# Patient Record
Sex: Female | Born: 1980 | Race: Asian | Hispanic: No | Marital: Married | Smoking: Never smoker
Health system: Southern US, Community
[De-identification: ages and names within clinical notes are randomized; demographics above are authoritative.]

## PROBLEM LIST (undated history)

## (undated) ENCOUNTER — Inpatient Hospital Stay (HOSPITAL_COMMUNITY): Payer: Self-pay

## (undated) DIAGNOSIS — E039 Hypothyroidism, unspecified: Secondary | ICD-10-CM

## (undated) DIAGNOSIS — Z789 Other specified health status: Secondary | ICD-10-CM

## (undated) DIAGNOSIS — E079 Disorder of thyroid, unspecified: Secondary | ICD-10-CM

## (undated) HISTORY — PX: APPENDECTOMY: SHX54

---

## 2011-09-24 ENCOUNTER — Emergency Department (HOSPITAL_BASED_OUTPATIENT_CLINIC_OR_DEPARTMENT_OTHER)
Admission: EM | Admit: 2011-09-24 | Discharge: 2011-09-25 | Disposition: A | Discharge status: Other Acute Inpatient Hospital | Payer: BC Managed Care – PPO | Source: Home / Self Care | Attending: Emergency Medicine | Admitting: Emergency Medicine

## 2011-09-24 ENCOUNTER — Emergency Department (INDEPENDENT_AMBULATORY_CARE_PROVIDER_SITE_OTHER): Payer: BC Managed Care – PPO

## 2011-09-24 DIAGNOSIS — R109 Unspecified abdominal pain: Secondary | ICD-10-CM

## 2011-09-24 DIAGNOSIS — R1031 Right lower quadrant pain: Secondary | ICD-10-CM | POA: Insufficient documentation

## 2011-09-24 DIAGNOSIS — K3580 Unspecified acute appendicitis: Secondary | ICD-10-CM | POA: Insufficient documentation

## 2011-09-24 DIAGNOSIS — E079 Disorder of thyroid, unspecified: Secondary | ICD-10-CM | POA: Insufficient documentation

## 2011-09-24 HISTORY — DX: Disorder of thyroid, unspecified: E07.9

## 2011-09-24 LAB — URINALYSIS, ROUTINE W REFLEX MICROSCOPIC
Glucose, UA: NEGATIVE mg/dL
Hgb urine dipstick: NEGATIVE
Specific Gravity, Urine: 1.007 (ref 1.005–1.030)
pH: 7 (ref 5.0–8.0)

## 2011-09-24 LAB — WET PREP, GENITAL: Yeast Wet Prep HPF POC: NONE SEEN

## 2011-09-24 LAB — CBC
HCT: 35.8 % — ABNORMAL LOW (ref 36.0–46.0)
Hemoglobin: 11.9 g/dL — ABNORMAL LOW (ref 12.0–15.0)
WBC: 12.7 10*3/uL — ABNORMAL HIGH (ref 4.0–10.5)

## 2011-09-24 LAB — BASIC METABOLIC PANEL
BUN: 7 mg/dL (ref 6–23)
CO2: 26 mEq/L (ref 19–32)
Chloride: 104 mEq/L (ref 96–112)
Creatinine, Ser: 0.6 mg/dL (ref 0.50–1.10)
Potassium: 4.4 mEq/L (ref 3.5–5.1)

## 2011-09-24 LAB — DIFFERENTIAL
Basophils Absolute: 0 10*3/uL (ref 0.0–0.1)
Lymphocytes Relative: 21 % (ref 12–46)
Monocytes Absolute: 0.7 10*3/uL (ref 0.1–1.0)
Monocytes Relative: 6 % (ref 3–12)
Neutro Abs: 9.1 10*3/uL — ABNORMAL HIGH (ref 1.7–7.7)
Neutrophils Relative %: 72 % (ref 43–77)

## 2011-09-24 LAB — PREGNANCY, URINE: Preg Test, Ur: NEGATIVE

## 2011-09-24 MED ORDER — FENTANYL CITRATE 0.05 MG/ML IJ SOLN
100.0000 ug | Freq: Once | INTRAMUSCULAR | Status: AC
  Administered 2011-09-24: 100 ug via INTRAVENOUS
  Filled 2011-09-24: qty 2

## 2011-09-24 MED ORDER — ONDANSETRON HCL 4 MG/2ML IJ SOLN
4.0000 mg | Freq: Once | INTRAMUSCULAR | Status: AC
  Administered 2011-09-24: 4 mg via INTRAVENOUS
  Filled 2011-09-24: qty 2

## 2011-09-24 MED ORDER — IOHEXOL 300 MG/ML  SOLN
100.0000 mL | Freq: Once | INTRAMUSCULAR | Status: AC | PRN
  Administered 2011-09-24: 100 mL via INTRAVENOUS

## 2011-09-24 MED ORDER — IOHEXOL 300 MG/ML  SOLN: 100.0000 mL | Freq: Once | INTRAMUSCULAR | Status: AC | PRN

## 2011-09-24 MED ORDER — DEXTROSE 5 % IV SOLN
1.0000 g | Freq: Once | INTRAVENOUS | Status: AC
  Administered 2011-09-24: 1 g via INTRAVENOUS
  Filled 2011-09-24: qty 1

## 2011-09-24 NOTE — ED Notes (Signed)
All LDA's (lines, drains, airways, and wounds) were removed from documentation for discharge purposes.  At time of discharge all LDA's remained intact as were previously documented.    

## 2011-09-24 NOTE — ED Notes (Signed)
Transported to ct via stretcher

## 2011-09-24 NOTE — ED Provider Notes (Signed)
History     CSN: [id number] Arrival date & time: 09/24/2011  8:03 PM   First MD Initiated Contact with Patient 09/24/11 2021      Chief Complaint  Patient presents with  . Abdominal Pain    (Consider location/radiation/quality/duration/timing/severity/associated sxs/prior treatment) Patient is a 30 y.o. female presenting with abdominal pain.  Abdominal Pain The primary symptoms of the illness include abdominal pain.    Past Medical History  Diagnosis Date  . Thyroid disease     History reviewed. No pertinent past surgical history.  History reviewed. No pertinent family history.  History  Substance Use Topics  . Smoking status: Never Smoker   . Smokeless tobacco: Not on file  . Alcohol Use: Yes     occasional drinker    OB History    Grav Para Term Preterm Abortions TAB SAB Ect Mult Living                  Review of Systems  Gastrointestinal: Positive for abdominal pain.    Allergies  Review of patient's allergies indicates no known allergies.  Home Medications   Current Outpatient Rx  Name Route Sig Dispense Refill  . IBUPROFEN 200 MG PO TABS Oral Take 200 mg by mouth once.      Marland Kitchen LEVOTHYROXINE SODIUM 50 MCG PO TABS Oral Take 50 mcg by mouth daily.        BP 106/75  Pulse 97  Temp(Src) 99.1 F (37.3 C) (Oral)  Resp 17  Ht 5\' 2"  (1.575 m)  Wt 115 lb (52.164 kg)  BMI 21.03 kg/m2  SpO2 100%  LMP 08/25/2011  Physical Exam  ED Course  Procedures (including critical care time)  Labs Reviewed  CBC - Abnormal; Notable for the following:    WBC 12.7 (*)    Hemoglobin 11.9 (*)    HCT 35.8 (*)    MCV 74.1 (*)    MCH 24.6 (*)    RDW 16.0 (*)    All other components within normal limits  DIFFERENTIAL - Abnormal; Notable for the following:    Neutro Abs 9.1 (*)    All other components within normal limits  WET PREP, GENITAL - Abnormal; Notable for the following:    Clue Cells, Wet Prep MODERATE (*)    WBC, Wet Prep HPF POC MODERATE (*)    All  other components within normal limits  URINALYSIS, ROUTINE W REFLEX MICROSCOPIC  PREGNANCY, URINE  BASIC METABOLIC PANEL   Ct Abdomen Pelvis W Contrast  09/24/2011  *RADIOLOGY REPORT*  Clinical Data: Mid abdominal pain with acute onset earlier today, worse with ambulation.  CT ABDOMEN AND PELVIS WITH CONTRAST 09/24/2011:  Technique:  Multidetector CT imaging of the abdomen and pelvis was performed following the standard protocol during bolus administration of intravenous contrast.  Contrast: OMNIPAQUE IOHEXOL 300 MG/ML IV SOLN Oral contrast was also administered.  Comparison: None.  Findings: Appendix mildly inflamed with enhancing mucosa and mild periappendiceal edema.  No evidence of abscess.  Stomach, small bowel, and colon normal in appearance.  Normal appearing liver with an anatomic variant in that the left lobe extends well across the midline into the left upper quadrant. Normal spleen, pancreas, adrenal glands, and kidneys.  Gallbladder unremarkable by CT.  No biliary ductal dilation.  No visible aorto- iliofemoral atherosclerosis.  Widely patent visceral arteries.  No significant lymphadenopathy; scattered small lymph nodes in the periappendiceal region, likely reactive.  Uterus and ovaries unremarkable for age; enhancing functional cyst in the  left ovary.  Small amount of free fluid in the cul-de-sac, likely physiologic.  Urinary bladder unremarkable.  Bone window images unremarkable.  Visualized lung bases clear.  Heart size normal.  IMPRESSION:  1.  Acute appendicitis.  No evidence of periappendiceal abscess. 2.  Otherwise normal examination.  Physiologic free fluid in the cul-de-sac.  These results were called by telephone on 09/24/2011  at  2305 hours to  Dr. Radford Pax of the emergency department, who verbally acknowledged these results.  Original Report Authenticated By: Arnell Sieving, M.D.     No diagnosis found.    Medical screening examination/treatment/procedure(s) were  performed by non-physician practitioner and as supervising physician I was immediately available for consultation/collaboration.    I spoke with Dr. Johna Sheriff  Who is  the surgeon on call who agreed to accept the patient at [facility].  [facility] arrange for transfer.       Nelia Shi, MD 09/24/11 2330

## 2011-09-24 NOTE — ED Provider Notes (Signed)
History     CSN: [id number] Arrival date & time: 09/24/2011  8:03 PM   First MD Initiated Contact with Patient 09/24/11 2021      Chief Complaint  Patient presents with  . Abdominal Pain    (Consider location/radiation/quality/duration/timing/severity/associated sxs/prior treatment) Patient is a 30 y.o. female presenting with abdominal pain.  Abdominal Pain The primary symptoms of the illness include abdominal pain. The primary symptoms of the illness do not include vaginal discharge. The current episode started 13 to 24 hours ago. The onset of the illness was gradual. The problem has been gradually worsening.  The illness is associated with eating. The patient states that she believes she is currently not pregnant. The patient has not had a change in bowel habit. Significant associated medical issues do not include PUD, inflammatory bowel disease or gallstones.   patient reports she began having abdominal pain around 11 AM today patient reports that she thought it might be caused by a piece of chocolate cake that she ate for breakfast. Patient complains of feeling like she is bloated patient reports she feels very gassy. Patient denies fever or chills she denies any vaginal discharge patient denies any risk of any sexually transmitted disease she has not had any abdominal surgeries   Past Medical History  Diagnosis Date  . Thyroid disease     History reviewed. No pertinent past surgical history.  History reviewed. No pertinent family history.  History  Substance Use Topics  . Smoking status: Never Smoker   . Smokeless tobacco: Not on file  . Alcohol Use: Yes     occasional drinker    OB History    Grav Para Term Preterm Abortions TAB SAB Ect Mult Living                  Review of Systems  Gastrointestinal: Positive for abdominal pain.  Genitourinary: Negative for vaginal discharge, vaginal pain and pelvic pain.  All other systems reviewed and are  negative.    Allergies  Review of patient's allergies indicates no known allergies.  Home Medications   Current Outpatient Rx  Name Route Sig Dispense Refill  . IBUPROFEN 200 MG PO TABS Oral Take 200 mg by mouth once.      Marland Kitchen LEVOTHYROXINE SODIUM 50 MCG PO TABS Oral Take 50 mcg by mouth daily.        BP 106/75  Pulse 97  Temp(Src) 99.1 F (37.3 C) (Oral)  Resp 17  Ht 5\' 2"  (1.575 m)  Wt 115 lb (52.164 kg)  BMI 21.03 kg/m2  SpO2 100%  LMP 08/25/2011  Physical Exam  Nursing note and vitals reviewed. Constitutional: She is oriented to person, place, and time. She appears well-developed and well-nourished.  HENT:  Head: Normocephalic and atraumatic.  Right Ear: External ear normal.  Left Ear: External ear normal.  Nose: Nose normal.  Mouth/Throat: Oropharynx is clear and moist.  Eyes: Conjunctivae and EOM are normal. Pupils are equal, round, and reactive to light.  Neck: Normal range of motion. Neck supple.  Cardiovascular: Normal rate and normal heart sounds.   Pulmonary/Chest: Effort normal and breath sounds normal.  Abdominal: Soft. There is tenderness.       Tender right lower quadrant and suprapubic area  Genitourinary: Vagina normal and uterus normal.  Musculoskeletal: Normal range of motion.  Neurological: She is alert and oriented to person, place, and time. She has normal reflexes.  Skin: Skin is warm and dry.  Psychiatric: She has a normal mood  and affect.    ED Course  Procedures (including critical care time)   Labs Reviewed  URINALYSIS, ROUTINE W REFLEX MICROSCOPIC  PREGNANCY, URINE   No results found.   No diagnosis found.    MDM          Langston Masker, PA 09/24/11 2154  Langston Masker, [place] 09/24/11 2155

## 2011-09-24 NOTE — ED Notes (Signed)
Via carelink--spoke with [name] [name] 

## 2011-09-24 NOTE — ED Provider Notes (Signed)
Medical screening examination/treatment/procedure(s) were conducted as a shared visit with non-physician practitioner(s) and myself.  I personally evaluated the patient during the encounter   Nelia Shi, MD 09/24/11 [phone]

## 2011-09-24 NOTE — ED Notes (Signed)
Pt returned from CT requesting pain medication. Ambulatory to bathroom.

## 2011-09-24 NOTE — ED Notes (Signed)
Pt c/o mid abdominal pain which increases with ambulation.  Pt states that it is a cramping sensation with onset today while she was at school.  Pt states that abdomen is painful to palpation.  Denies n/v/d/c, believes that this may be related to gas.  Denies dysuria, hematuria, discharge

## 2011-09-25 ENCOUNTER — Observation Stay (HOSPITAL_COMMUNITY)
Admission: EM | Admit: 2011-09-25 | Discharge: 2011-09-26 | DRG: 883 | Disposition: A | Discharge status: Home / Self Care | Payer: BC Managed Care – PPO | Attending: General Surgery | Admitting: General Surgery

## 2011-09-25 ENCOUNTER — Other Ambulatory Visit (INDEPENDENT_AMBULATORY_CARE_PROVIDER_SITE_OTHER): Payer: Self-pay | Admitting: General Surgery

## 2011-09-25 DIAGNOSIS — K3580 Unspecified acute appendicitis: Principal | ICD-10-CM | POA: Insufficient documentation

## 2011-09-25 DIAGNOSIS — E039 Hypothyroidism, unspecified: Secondary | ICD-10-CM | POA: Insufficient documentation

## 2011-09-25 DIAGNOSIS — D72828 Other elevated white blood cell count: Secondary | ICD-10-CM

## 2011-09-25 DIAGNOSIS — R1031 Right lower quadrant pain: Secondary | ICD-10-CM

## 2011-09-25 DIAGNOSIS — Z79899 Other long term (current) drug therapy: Secondary | ICD-10-CM | POA: Insufficient documentation

## 2011-09-27 NOTE — H&P (Signed)
  [name], [name]             ACCOUNT NO.:  [id number]  MEDICAL RECORD NO.:  [id number]  LOCATION:  WLED                         FACILITY:  [facility]  PHYSICIAN:  Sharlet Salina T. Farhaan Mabee, M.D.DATE OF BIRTH:  11-May-1981  DATE OF ADMISSION:  09/25/2011 DATE OF DISCHARGE:                             HISTORY & PHYSICAL   CHIEF COMPLAINT:  Abdominal pain.  HISTORY OF PRESENT ILLNESS:  This patient is a 30 year old female who about 36 hours prior to admission developed the gradual onset of persistent right lower quadrant abdominal pain.  This has been moderately severe.  The pain has been persistent and gradually worsening.  It is little worse with motion.  She has not had any nausea or vomiting.  No fever.  Denies lack of appetite.  She has no history of any recurrent or chronic similar symptoms.  PAST MEDICAL HISTORY:  No previous surgery.  Medically, she is treated for hypothyroidism.  MEDICATIONS:  Synthroid 300 mcg daily.  ALLERGIES:  None.  SOCIAL HISTORY:  She is married.  Drinks occasional alcohol.  Does not smoke cigarettes.  She is a Consulting civil engineer.  FAMILY HISTORY:  Significant for stroke and hypertension.  REVIEW OF SYSTEMS:  GENERAL:  No fever, chills, or malaise. RESPIRATORY:  No shortness breath, cough, or wheezing.  CARDIAC:  No heart murmurs or history of heart disease.  ABDOMEN/GI as above.  GU: No urinary burning, frequency, or discharge.  PHYSICAL EXAMINATION:  VITAL SIGNS:  Temperature is 98.3, heart rate 98, blood pressure 107/61, respirations 16. GENERAL:  She is a well-developed female who does not appear in any distress. SKIN:  Warm, dry.  No rash or infection. HEENT:  No palpable masses or thyromegaly.  Sclerae nonicteric.  Pupils equal, round, and reactive.  Oropharynx clear. LYMPH NODES:  No cervical, supraclavicular, or inguinal nodes palpable. LUNGS:  Clear without wheezing or increased work of breathing. CARDIAC:  Regular rate and rhythm.  No  murmurs. ABDOMEN:  Bowel sounds are hypoactive.  There is localized right lower quadrant tenderness with some guarding.  No peritoneal signs.  No palpable masses or organomegaly or hernias. EXTREMITIES:  No joint swelling, deformity. NEUROLOGIC:  Alert, fully oriented.  Gait normal.  LABORATORY/X-RAY:  [place] at [address], hemoglobin 11.9. Electrolytes normal.  CT scan of the abdomen and pelvis was obtained from the emergency room. This shows evidence of acute appendicitis with thickening and mild periappendiceal inflammation, and no evidence of perforation.  ASSESSMENT/PLAN:  Symptoms, physical findings, and imaging consistent with acute appendicitis.  I have recommended proceeding with laparoscopic appendectomy.  She is receiving preoperative IV antibiotics.     Lorne Skeens. Amaro Mangold, M.D.     Tory Emerald  D:  09/25/2011  T:  09/25/2011  Job:  [id number]  Electronically Signed by Glenna Fellows M.D. on 09/27/2011 12:38:07 PM

## 2011-09-27 NOTE — Op Note (Signed)
  [name], [name] [name]             ACCOUNT NO.:  [id number]  MEDICAL RECORD NO.:  [id number]  LOCATION:  WLED                         FACILITY:  [facility]  PHYSICIAN:  [name] [name], M.D.DATE OF BIRTH:  1981-01-13  DATE OF PROCEDURE:  09/25/2011 DATE OF DISCHARGE:                              OPERATIVE REPORT   PREOPERATIVE DIAGNOSIS:  Acute appendicitis.  POSTOPERATIVE DIAGNOSIS:  Acute appendicitis.  PROCEDURE:  Laparoscopic appendectomy.  SURGEON:  [name] [name], M.D.  ANESTHESIA:  General.  BRIEF HISTORY:  This patient is a 30 year old female who presents with typical symptoms, physical findings, and imaging for uncomplicated acute appendicitis.  I have recommended proceeding with laparoscopic appendectomy.  [name] [name] of the procedure, its indications, the risks of anesthetic complications, bleeding, infection, possible need for open procedure were discussed and understood.  She was now brought to the operating room for this procedure.  DESCRIPTION OF OPERATION:  The patient was brought to the operating room, placed in supine position on operating table and general endotracheal anesthesia was induced.  She had received preoperative IV antibiotics.  PAS were in place.  The abdomen was widely sterilely prepped and draped.  Foley catheter had been placed.  Correct patient and procedure were verified.  Access was obtained with a 1 cm open Hasson technique with the incision in the umbilicus and pneumoperitoneum was established without difficulty.  Under direct vision, a 5 mm trocar was placed in the right upper quadrant, a 12 mm trocar in the left lower quadrant.  The appendix was exposed, lying lateral to the cecum and appeared to have early, but distinct acute appendicitis without evidence of gangrene or perforation.  The tip of the appendix was grasped and elevated.  Lateral peritoneal attachments were divided with Harmonic scalpel mobilizing the appendix.  The  mesoappendix was then sequentially divided with Harmonic scalpel completely, freeing the appendix down to the tip of the cecum.  The base was noninflamed.  Appendix was divided from the tip of the cecum with a single firing of the Endo-GIA 45 mm blue load stapler.  The staple line was intact without bleeding.  The appendix was placed in Endocatch bag and brought out through the umbilical incision.  The abdomen was inspected for hemostasis, which appeared complete.  There was no evidence of trocar injury or other problems.  All CO2 was evacuated.  The mattress sutures was secured at the umbilicus.  Skin incisions were closed with subcuticular Monocryl and Dermabond.  Sponge and needle counts correct.  The patient taken to recovery in good condition.     [name] [name], M.D.     [name] [name]  D:  09/25/2011  T:  09/25/2011  Job:  [id number]  Electronically Signed by Glenna Fellows M.D. on 09/27/2011 12:38:00 PM

## 2011-10-02 NOTE — Discharge Summary (Signed)
  NAMEALYX, [name] [name]             ACCOUNT NO.:  [id number]  MEDICAL RECORD NO.:  [id number]  LOCATION:  1537                         FACILITY:  [facility]  PHYSICIAN:  Sandria Bales. Ezzard Standing, M.D.  DATE OF BIRTH:  03-17-1981  DATE OF ADMISSION:  09/25/2011 DATE OF DISCHARGE:  09/26/2011                              DISCHARGE SUMMARY  DISCHARGE DIAGNOSIS:  Acute appendicitis.  OPERATION PERFORMED:  She had a laparoscopic appendectomy by Dr. Jaclynn Guarneri on September 25, 2011.  HISTORY OF ILLNESS:  This is a 30 year old [place] female who had 36 hours history of abdominal pain prior to presenting to the [facility] ER.  She was transferred to [facility] after CT suggested acute appendicitis.  PAST MEDICAL HISTORY:  Noncontributory.  She takes Synthroid for hypothyroidism as her only medicine.  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  She is in a graduate school.  She otherwise has a negative history.  HOSPITAL COURSE:  She was taken to the operating room on September 25, 2011 by Dr. Johna Sheriff where she underwent a laparoscopic appendectomy for acute appendicitis.  Her final pathology is pending at the time of this dictation.  She is now 1 day postop.  She is afebrile.  Her abdomen is soft.  She is tolerating liquids and ready to go home.  Her husband is at her bedside.  DISCHARGE INSTRUCTIONS:  She is given a note to be out of school till November 1st.  She should not drive for 3 days.  She should eat light and then could advance her diet.  She can shower starting Monday, October 29.  She is to call our office, make appointment to see Dr. Johna Sheriff in 2 or 3 weeks and call for any interval problem.  She is given a prescription for Vicodin for pain and can resume her Synthroid as her usual dose.  DISCHARGE CONDITION:  Good.   Sandria Bales. Ezzard Standing, M.D., FACS   DHN/MEDQ  D:  09/26/2011  T:  09/26/2011  Job:  [id number]  cc:   Zelphia Cairo, MD Fax:  [phone]  Electronically Signed by Ovidio Kin M.D. on 10/02/2011 06:31:23 AM

## 2011-11-18 ENCOUNTER — Encounter (INDEPENDENT_AMBULATORY_CARE_PROVIDER_SITE_OTHER): Payer: Self-pay | Admitting: General Surgery

## 2011-11-18 ENCOUNTER — Ambulatory Visit (INDEPENDENT_AMBULATORY_CARE_PROVIDER_SITE_OTHER): Payer: BC Managed Care – PPO | Admitting: General Surgery

## 2011-11-18 VITALS — BP 98/70 | HR 86 | Temp 97.7°F | Ht 61.5 in | Wt 121.8 lb

## 2011-11-18 DIAGNOSIS — Z09 Encounter for follow-up examination after completed treatment for conditions other than malignant neoplasm: Secondary | ICD-10-CM

## 2011-11-18 NOTE — Patient Instructions (Signed)
May resume full physical activity. Call for any concerns or questions

## 2011-11-18 NOTE — Progress Notes (Signed)
Patient returns almost one month following emergency laparoscopic appendectomy for uncomplicated acute appendicitis. She is doing well with no postoperative concerns.  Pathology showed acute appendicitis  Exam: Appears well. Abdomen soft and nontender. Wounds well healed.  Assessment plan: Doing well following appendectomy without complication. Can resume full activity. Return as needed.

## 2013-01-17 LAB — OB RESULTS CONSOLE ABO/RH: RH Type: POSITIVE

## 2013-01-17 LAB — OB RESULTS CONSOLE GC/CHLAMYDIA
Chlamydia: NEGATIVE
Gonorrhea: NEGATIVE

## 2013-01-19 ENCOUNTER — Encounter (HOSPITAL_COMMUNITY): Payer: Self-pay

## 2013-01-19 ENCOUNTER — Inpatient Hospital Stay (HOSPITAL_COMMUNITY): Payer: BC Managed Care – PPO

## 2013-01-19 ENCOUNTER — Inpatient Hospital Stay (HOSPITAL_COMMUNITY)
Admission: AD | Admit: 2013-01-19 | Discharge: 2013-01-19 | Disposition: A | Discharge status: Home / Self Care | Payer: BC Managed Care – PPO | Source: Ambulatory Visit | Attending: Obstetrics and Gynecology | Admitting: Obstetrics and Gynecology

## 2013-01-19 DIAGNOSIS — O209 Hemorrhage in early pregnancy, unspecified: Secondary | ICD-10-CM

## 2013-01-19 DIAGNOSIS — O418X20 Other specified disorders of amniotic fluid and membranes, second trimester, not applicable or unspecified: Secondary | ICD-10-CM

## 2013-01-19 DIAGNOSIS — O4692 Antepartum hemorrhage, unspecified, second trimester: Secondary | ICD-10-CM

## 2013-01-19 DIAGNOSIS — R109 Unspecified abdominal pain: Secondary | ICD-10-CM

## 2013-01-19 HISTORY — DX: Hypothyroidism, unspecified: E03.9

## 2013-01-19 LAB — URINALYSIS, ROUTINE W REFLEX MICROSCOPIC
Bilirubin Urine: NEGATIVE
Ketones, ur: NEGATIVE mg/dL
Leukocytes, UA: NEGATIVE
Nitrite: NEGATIVE
Protein, ur: NEGATIVE mg/dL
Urobilinogen, UA: 0.2 mg/dL (ref 0.0–1.0)
pH: 6 (ref 5.0–8.0)

## 2013-01-19 LAB — URINE MICROSCOPIC-ADD ON

## 2013-01-19 NOTE — MAU Provider Note (Signed)
History     CSN: [id number]  Arrival date and time: 01/19/13 1814   First Provider Initiated Contact with Patient 01/19/13 1843      Chief Complaint  Patient presents with  . Abdominal Pain  . Vaginal Bleeding   HPI [name] [name] is 32 y.o. G1P0 Unknown weeks presenting with vaginal bleeding with clot seen at 5:30pm today.  Denies abdominal pain.  Patient of Dr. Renaldo Fiddler.  Had ultrasound 2/4 in the office showing the pregnancy was 10w 2day.  Denies recent intercourse.  Denies nausea and vomiting.    Past Medical History  Diagnosis Date  . Thyroid disease   . Hypothyroid     Past Surgical History  Procedure Laterality Date  . Appendectomy      Family History  Problem Relation Age of Onset  . Hyperlipidemia Mother   . Hypertension Mother   . Hyperlipidemia Father   . Hypertension Father     History  Substance Use Topics  . Smoking status: Never Smoker   . Smokeless tobacco: Never Used  . Alcohol Use: Yes     Comment: occasional drinker    Allergies: No Known Allergies  Prescriptions prior to admission  Medication Sig Dispense Refill  . ibuprofen (ADVIL,MOTRIN) 200 MG tablet Take 200 mg by mouth once.        Marland Kitchen levothyroxine (SYNTHROID, LEVOTHROID) 50 MCG tablet Take 50 mcg by mouth daily.          Review of Systems  Constitutional: Negative.   Gastrointestinal: Negative for nausea and abdominal pain.  Genitourinary:       + vaginal bleeding.   Physical Exam   Blood pressure 120/77, pulse 123, temperature 97.7 F (36.5 C), temperature source Oral, resp. rate 20, height 5\' 2"  (1.575 m), weight 122 lb 6.4 oz (55.52 kg), last menstrual period 10/09/2012.  Physical Exam  Constitutional: She is oriented to person, place, and time. She appears well-developed and well-nourished. No distress.  HENT:  Head: Normocephalic.  Neck: Normal range of motion.  Cardiovascular: Normal rate.   Respiratory: Effort normal.  GI: Soft. She exhibits no distension and no  mass. There is no tenderness. There is no rebound and no guarding.  Genitourinary: Uterus is enlarged. Uterus is not tender. There is bleeding (small amount of bright red bleeding without clot) around the vagina. No vaginal discharge found.  Cervix closed.  Neurological: She is alert and oriented to person, place, and time.  Skin: Skin is warm and dry.  Psychiatric: She has a normal mood and affect.   Results for orders placed during the hospital encounter of 01/19/13 (from the past 24 hour(s))  URINALYSIS, ROUTINE W REFLEX MICROSCOPIC     Status: Abnormal   Collection Time    01/19/13  6:20 PM      Result Value Range   Color, Urine YELLOW  YELLOW   APPearance HAZY (*) CLEAR   Specific Gravity, Urine 1.020  1.005 - 1.030   pH 6.0  5.0 - 8.0   Glucose, UA NEGATIVE  NEGATIVE mg/dL   Hgb urine dipstick LARGE (*) NEGATIVE   Bilirubin Urine NEGATIVE  NEGATIVE   Ketones, ur NEGATIVE  NEGATIVE mg/dL   Protein, ur NEGATIVE  NEGATIVE mg/dL   Urobilinogen, UA 0.2  0.0 - 1.0 mg/dL   Nitrite NEGATIVE  NEGATIVE   Leukocytes, UA NEGATIVE  NEGATIVE  URINE MICROSCOPIC-ADD ON     Status: Abnormal   Collection Time    01/19/13  6:20 PM  Result Value Range   Squamous Epithelial / LPF FEW (*) RARE   WBC, UA 0-2  <3 WBC/hpf   RBC / HPF TOO NUMEROUS TO COUNT  <3 RBC/hpf   Bacteria, UA FEW (*) RARE  POCT PREGNANCY, URINE     Status: Abnormal   Collection Time    01/19/13  6:28 PM      Result Value Range   Preg Test, Ur POSITIVE (*) NEGATIVE    Clinical Data: Vaginal bleeding. 14 weeks and 4 days pregnant by last menstrual period.  OBSTETRIC <14 WK ULTRASOUND  Technique: Transabdominal ultrasound was performed for evaluation of the gestation as well as the maternal uterus and adnexal regions.  Comparison: None.  Intrauterine gestational sac: Visualized/normal in shape. Yolk sac: Not visualized Embryo: Visualized Cardiac Activity: Visualized Heart Rate: 161 bpm  CRL: 66.0 mm 13 w 0  d [place] EDC: 07/27/2013  Maternal uterus/Adnexae: The maternal ovaries are not visualized. No free peritoneal fluid is seen. Small moderate sized subchorionic hemorrhage.  IMPRESSION:  1. Single live intrauterine gestation with an estimated gestational age of [redacted] weeks and 0 days. 2. Small to moderate sized subchorionic hemorrhage.   Original Report Authenticated By: Beckie Salts, M.D.        MAU Course  Procedures  MDM 18:55 Reported MSE and pelvic exam to Dr. Renaldo Fiddler.  Order for ultrasound to evaluate bleeding. 18:45  Reported Ultrasound findings to Dr. Renaldo Fiddler.  May discharge home with pelvic rest instructions and keep scheduled appt.  Call for worsening sxs.  Assessment and Plan  A:  Single living intrauterine gestation at [redacted]w[redacted]d gestation      Small-moderate sized subchorionic hemorrhage  P:  Pelvic rest until bleeding stops       Keep scheduled appointment     Call for increased sxs.  Keivon Garden,EVE M 01/19/2013, 6:46 PM

## 2013-01-19 NOTE — MAU Note (Signed)
Denies any cramping, states just had a mild pelvic pain- none now.

## 2013-01-19 NOTE — MAU Note (Signed)
Bleeding started about an hour ago.  Passed one small clot. Denies pain at this time.  [place] confirmed IUP at 10 wks.

## 2013-01-19 NOTE — MAU Note (Signed)
Patient states onset of vaginal bleeding about 1 hour ago with cramping. Patient is [redacted] weeks gestation.

## 2013-01-19 NOTE — MAU Note (Signed)
Has a 45day cycle

## 2013-06-15 ENCOUNTER — Inpatient Hospital Stay (HOSPITAL_COMMUNITY)
Admission: AD | Admit: 2013-06-15 | Discharge: 2013-06-19 | DRG: 372 | Disposition: A | Discharge status: Home / Self Care | Payer: BC Managed Care – PPO | Source: Ambulatory Visit | Attending: Obstetrics and Gynecology | Admitting: Obstetrics and Gynecology

## 2013-06-15 ENCOUNTER — Encounter (HOSPITAL_COMMUNITY): Payer: Self-pay | Admitting: *Deleted

## 2013-06-15 DIAGNOSIS — O365931 Maternal care for other known or suspected poor fetal growth, third trimester, fetus 1: Secondary | ICD-10-CM

## 2013-06-15 DIAGNOSIS — O4290 Premature rupture of membranes, unspecified as to length of time between rupture and onset of labor, unspecified weeks of gestation: Principal | ICD-10-CM | POA: Diagnosis present

## 2013-06-15 DIAGNOSIS — E079 Disorder of thyroid, unspecified: Secondary | ICD-10-CM | POA: Diagnosis present

## 2013-06-15 DIAGNOSIS — E039 Hypothyroidism, unspecified: Secondary | ICD-10-CM | POA: Diagnosis present

## 2013-06-15 NOTE — MAU Note (Signed)
Pt states she has been leaking a clearish fluid since 2200

## 2013-06-16 ENCOUNTER — Inpatient Hospital Stay (HOSPITAL_COMMUNITY): Payer: BC Managed Care – PPO

## 2013-06-16 LAB — URINALYSIS, ROUTINE W REFLEX MICROSCOPIC
Glucose, UA: NEGATIVE mg/dL
Leukocytes, UA: NEGATIVE
Specific Gravity, Urine: <1.005 — ABNORMAL LOW (ref 1.005–1.030)
pH: 6.5 (ref 5.0–8.0)

## 2013-06-16 LAB — CBC
HCT: 35.5 % — ABNORMAL LOW (ref 36.0–46.0)
MCV: 81.4 fL (ref 78.0–100.0)
Platelets: 197 10*3/uL (ref 150–400)
RBC: 4.36 MIL/uL (ref 3.87–5.11)
WBC: 11.5 10*3/uL — ABNORMAL HIGH (ref 4.0–10.5)

## 2013-06-16 LAB — URINE MICROSCOPIC-ADD ON

## 2013-06-16 LAB — POCT FERN TEST: POCT Fern Test: POSITIVE

## 2013-06-16 MED ORDER — ZOLPIDEM TARTRATE 5 MG PO TABS: 5.0000 mg | ORAL_TABLET | Freq: Every evening | ORAL | Status: DC | PRN

## 2013-06-16 MED ORDER — SODIUM CHLORIDE 0.9 % IV SOLN
2.0000 g | Freq: Four times a day (QID) | INTRAVENOUS | Status: DC
  Administered 2013-06-16 – 2013-06-17 (×6): 2 g via INTRAVENOUS
  Filled 2013-06-16 (×8): qty 2000

## 2013-06-16 MED ORDER — CALCIUM CARBONATE ANTACID 500 MG PO CHEW: 2.0000 | CHEWABLE_TABLET | ORAL | Status: DC | PRN

## 2013-06-16 MED ORDER — ACETAMINOPHEN 325 MG PO TABS: 650.0000 mg | ORAL_TABLET | ORAL | Status: DC | PRN

## 2013-06-16 MED ORDER — PRENATAL MULTIVITAMIN CH
1.0000 | ORAL_TABLET | Freq: Every day | ORAL | Status: DC
  Filled 2013-06-16: qty 1

## 2013-06-16 MED ORDER — AZITHROMYCIN 500 MG PO TABS
500.0000 mg | ORAL_TABLET | Freq: Every day | ORAL | Status: DC
  Filled 2013-06-16: qty 1

## 2013-06-16 MED ORDER — DOCUSATE SODIUM 100 MG PO CAPS: 100.0000 mg | ORAL_CAPSULE | Freq: Every day | ORAL | Status: DC

## 2013-06-16 MED ORDER — PRENATAL MULTIVITAMIN CH: 1.0000 | ORAL_TABLET | Freq: Every day | ORAL | Status: DC

## 2013-06-16 MED ORDER — AMOXICILLIN 500 MG PO CAPS
500.0000 mg | ORAL_CAPSULE | Freq: Three times a day (TID) | ORAL | Status: DC
  Filled 2013-06-16: qty 1

## 2013-06-16 MED ORDER — LEVOTHYROXINE SODIUM 50 MCG PO TABS
50.0000 ug | ORAL_TABLET | Freq: Every day | ORAL | Status: DC
  Filled 2013-06-16: qty 1

## 2013-06-16 MED ORDER — LEVOTHYROXINE SODIUM 88 MCG PO TABS
88.0000 ug | ORAL_TABLET | Freq: Every day | ORAL | Status: DC
  Administered 2013-06-17: 88 ug via ORAL
  Filled 2013-06-16 (×3): qty 1

## 2013-06-16 MED ORDER — LACTATED RINGERS IV SOLN
INTRAVENOUS | Status: DC
  Administered 2013-06-16: 20:00:00 via INTRAVENOUS
  Administered 2013-06-16: 125 mL/h via INTRAVENOUS
  Administered 2013-06-17: 06:00:00 via INTRAVENOUS

## 2013-06-16 MED ORDER — DOCUSATE SODIUM 100 MG PO CAPS
100.0000 mg | ORAL_CAPSULE | Freq: Every day | ORAL | Status: DC
  Administered 2013-06-16: 100 mg via ORAL
  Filled 2013-06-16 (×2): qty 1

## 2013-06-16 MED ORDER — DEXTROSE 5 % IV SOLN
500.0000 mg | INTRAVENOUS | Status: AC
  Administered 2013-06-16 – 2013-06-17 (×2): 500 mg via INTRAVENOUS
  Filled 2013-06-16 (×2): qty 500

## 2013-06-16 NOTE — Progress Notes (Signed)
To U/S via w/c accompanied by husband and NA. FM off.  SCDs placed at FOB.

## 2013-06-16 NOTE — Progress Notes (Signed)
Pt. Reports pink discharge on tissues in BR.  Very light pink blood on tissue noted after wiping perineum.  Assisted back to bed.

## 2013-06-16 NOTE — Progress Notes (Signed)
Dr. Henderson Cloud performed bedside portable u/s.  Fetus is vertex.  Pt. Tolerated procedure well.

## 2013-06-16 NOTE — Progress Notes (Signed)
Has light pink with wiping Feeling some UCs in back  VSS Afeb Ut soft and NT  FHT reactive , UCs 4-5/hr  Results for orders placed during the hospital encounter of 06/15/13 (from the past 24 hour(s))  URINALYSIS, ROUTINE W REFLEX MICROSCOPIC     Status: Abnormal   Collection Time    06/15/13 11:10 PM      Result Value Range   Color, Urine YELLOW  YELLOW   APPearance CLEAR  CLEAR   Specific Gravity, Urine <1.005 (*) 1.005 - 1.030   pH 6.5  5.0 - 8.0   Glucose, UA NEGATIVE  NEGATIVE mg/dL   Hgb urine dipstick TRACE (*) NEGATIVE   Bilirubin Urine NEGATIVE  NEGATIVE   Ketones, ur NEGATIVE  NEGATIVE mg/dL   Protein, ur NEGATIVE  NEGATIVE mg/dL   Urobilinogen, UA 0.2  0.0 - 1.0 mg/dL   Nitrite NEGATIVE  NEGATIVE   Leukocytes, UA NEGATIVE  NEGATIVE  URINE MICROSCOPIC-ADD ON     Status: Abnormal   Collection Time    06/15/13 11:10 PM      Result Value Range   Squamous Epithelial / LPF FEW (*) RARE   WBC, UA 0-2  <3 WBC/hpf   RBC / HPF 0-2  <3 RBC/hpf  POCT FERN TEST     Status: None   Collection Time    06/16/13 12:41 AM      Result Value Range   POCT Fern Test Positive = ruptured amniotic membanes    CBC     Status: Abnormal   Collection Time    06/16/13  8:00 AM      Result Value Range   WBC 11.5 (*) 4.0 - 10.5 K/uL   RBC 4.36  3.87 - 5.11 MIL/uL   Hemoglobin 11.6 (*) 12.0 - 15.0 g/dL   HCT 95.6 (*) 21.3 - 08.6 %   MCV 81.4  78.0 - 100.0 fL   MCH 26.6  26.0 - 34.0 pg   MCHC 32.7  30.0 - 36.0 g/dL   RDW 57.8  46.9 - 62.9 %   Platelets 197  150 - 400 K/uL   U/S vtx, EFW at 28%, AFI in normal range  On latency atb  A: PPROM  P: D/W patient and husband above     If labors, will transfer to L&D     CBC in am

## 2013-06-16 NOTE — MAU Provider Note (Signed)
History     CSN: [id number]  Arrival date and time: 06/15/13 2253   None     Chief Complaint  Patient presents with  . Vaginal Discharge   Vaginal Discharge The patient's primary symptoms include a vaginal discharge.    [name] [name] is a 32 y.o. G1P0 at [redacted]w[redacted]d who presents today with leaking of fluid. She states that around 1000 am she started having increased "egg white" like discahrge. She states that it increased throughout the day, and became more watery by tonight. She denies any UCs or VB.   Past Medical History  Diagnosis Date  . Thyroid disease   . Hypothyroid     Past Surgical History  Procedure Laterality Date  . Appendectomy      Family History  Problem Relation Age of Onset  . Hyperlipidemia Mother   . Hypertension Mother   . Hyperlipidemia Father   . Hypertension Father     History  Substance Use Topics  . Smoking status: Never Smoker   . Smokeless tobacco: Never Used  . Alcohol Use: Yes     Comment: occasional drinker    Allergies: No Known Allergies  Prescriptions prior to admission  Medication Sig Dispense Refill  . levothyroxine (SYNTHROID, LEVOTHROID) 50 MCG tablet Take 50 mcg by mouth daily.        . Prenatal Vit-Fe Fumarate-FA (PRENATAL MULTIVITAMIN) TABS Take 1 tablet by mouth daily.        Review of Systems  Genitourinary: Positive for vaginal discharge.   Physical Exam   Blood pressure 142/86, pulse 114, temperature 98.2 F (36.8 C), temperature source Oral, resp. rate 16, height 5\' 2"  (1.575 m), weight 72.576 kg (160 lb), last menstrual period 10/09/2012.  Physical Exam  Nursing note and vitals reviewed. Constitutional: She is oriented to person, place, and time. She appears well-developed and well-nourished. No distress.  Cardiovascular: Normal rate.   GI: Soft.  Genitourinary:  External: no lesion Vagina: pooling of fluid in the vault +fern Cervix: fluid seen, cervix 2/70/-2 Uterus: AGA   Neurological: She is alert  and oriented to person, place, and time.  Skin: Skin is warm and dry.  Psychiatric: She has a normal mood and affect.   FHT: 140, moderate with 15x15 accels, no decels Toco: irregular UCs MAU Course  Procedures  Results for orders placed during the hospital encounter of 06/15/13 (from the past 24 hour(s))  URINALYSIS, ROUTINE W REFLEX MICROSCOPIC     Status: Abnormal   Collection Time    06/15/13 11:10 PM      Result Value Range   Color, Urine YELLOW  YELLOW   APPearance CLEAR  CLEAR   Specific Gravity, Urine <1.005 (*) 1.005 - 1.030   pH 6.5  5.0 - 8.0   Glucose, UA NEGATIVE  NEGATIVE mg/dL   Hgb urine dipstick TRACE (*) NEGATIVE   Bilirubin Urine NEGATIVE  NEGATIVE   Ketones, ur NEGATIVE  NEGATIVE mg/dL   Protein, ur NEGATIVE  NEGATIVE mg/dL   Urobilinogen, UA 0.2  0.0 - 1.0 mg/dL   Nitrite NEGATIVE  NEGATIVE   Leukocytes, UA NEGATIVE  NEGATIVE  URINE MICROSCOPIC-ADD ON     Status: Abnormal   Collection Time    06/15/13 11:10 PM      Result Value Range   Squamous Epithelial / LPF FEW (*) RARE   WBC, UA 0-2  <3 WBC/hpf   RBC / HPF 0-2  <3 RBC/hpf     Assessment and Plan  PPROM RN updated  Dr. Henderson Cloud and obtained admission orders.   [name] 06/16/2013, 12:36 AM

## 2013-06-16 NOTE — Progress Notes (Signed)
Returned from U/S via w/c .  Placing on FM.  Pt. Tolerated procedure well. IVF infusing well. No s/s of infiltration or c/o pain at site.

## 2013-06-16 NOTE — H&P (Signed)
[name] [name] is a 32 y.o. female presenting for PPROM of clear fluid yesterday evening. Evaluation in MAU C/W PROM and cervix 2/70/-2/vtx. Patient denies pain/fever/chills/dysuria. Scant blood with wiping x 1 after cx exam in MAU. Maternal Medical History:  Reason for admission: Rupture of membranes.   Fetal activity: Perceived fetal activity is normal.      OB History   Grav Para Term Preterm Abortions TAB SAB Ect Mult Living   1              Past Medical History  Diagnosis Date  . Thyroid disease   . Hypothyroid    Past Surgical History  Procedure Laterality Date  . Appendectomy     Family History: family history includes Hyperlipidemia in her father and mother and Hypertension in her father and mother. Social History:  reports that she has never smoked. She has never used smokeless tobacco. She reports that  drinks alcohol. She reports that she does not use illicit drugs.   Prenatal Transfer Tool  Maternal Diabetes: No Genetic Screening: Normal Maternal Ultrasounds/Referrals: Normal Fetal Ultrasounds or other Referrals:  None Maternal Substance Abuse:  No Significant Maternal Medications:  Meds include: Syntroid Significant Maternal Lab Results:  None Other Comments:  subchorionic bleeding earlier in pregnancy resolved  Review of Systems  Eyes: Negative for blurred vision.  Gastrointestinal: Negative for abdominal pain.  Neurological: Negative for headaches.    Dilation: 2 Effacement (%): 70 Station: -2 Exam by:: H.Hogan,CNM Blood pressure 111/53, pulse 97, temperature 98.5 F (36.9 C), temperature source Oral, resp. rate 20, height 5\' 2"  (1.575 m), weight 160 lb (72.576 kg), last menstrual period 10/09/2012. Maternal Exam:  Uterine Assessment: Contraction strength is mild.  Contraction frequency is rare.   Abdomen: Fetal presentation: vertex     Fetal Exam Fetal Monitor Review: Pattern: accelerations present.       Physical Exam  Cardiovascular:  Normal rate and regular rhythm.   Respiratory: Effort normal and breath sounds normal.  GI: Soft. There is no tenderness.  Neurological: She has normal reflexes.   bedside U/S = vertex Prenatal labs: ABO, Rh:   Antibody:   Rubella:   RPR:    HBsAg:    HIV:    GBS:     Assessment/Plan: 32 yo MF G1P0 with PPROM  Admit for BR with BRP, IV fluids, latency atb, U/S, CBC D/W patient and husband PROM, above plan, expectant management if patient and fetus have reassuring testing, allow delivery if labors  [name] [name] II,[name] [name] E 06/16/2013, 8:26 AM

## 2013-06-17 ENCOUNTER — Encounter (HOSPITAL_COMMUNITY): Payer: Self-pay | Admitting: Anesthesiology

## 2013-06-17 ENCOUNTER — Inpatient Hospital Stay (HOSPITAL_COMMUNITY): Payer: BC Managed Care – PPO | Admitting: Anesthesiology

## 2013-06-17 ENCOUNTER — Inpatient Hospital Stay (HOSPITAL_COMMUNITY): Payer: BC Managed Care – PPO

## 2013-06-17 LAB — CBC
Hemoglobin: 11.2 g/dL — ABNORMAL LOW (ref 12.0–15.0)
MCHC: 32.7 g/dL (ref 30.0–36.0)
MCV: 81.9 fL (ref 78.0–100.0)
Platelets: 183 10*3/uL (ref 150–400)
Platelets: 198 10*3/uL (ref 150–400)
RBC: 4.41 MIL/uL (ref 3.87–5.11)
RDW: 15.3 % (ref 11.5–15.5)
RDW: 15.1 % (ref 11.5–15.5)
WBC: 12.1 10*3/uL — ABNORMAL HIGH (ref 4.0–10.5)

## 2013-06-17 LAB — RPR: RPR Ser Ql: NONREACTIVE

## 2013-06-17 MED ORDER — ONDANSETRON HCL 4 MG/2ML IJ SOLN: 4.0000 mg | Freq: Four times a day (QID) | INTRAMUSCULAR | Status: DC | PRN

## 2013-06-17 MED ORDER — PRENATAL MULTIVITAMIN CH
1.0000 | ORAL_TABLET | Freq: Every day | ORAL | Status: DC
  Administered 2013-06-18 – 2013-06-19 (×2): 1 via ORAL
  Filled 2013-06-17 (×4): qty 1

## 2013-06-17 MED ORDER — EPHEDRINE 5 MG/ML INJ
INTRAVENOUS | Status: AC
  Filled 2013-06-17: qty 4

## 2013-06-17 MED ORDER — TETANUS-DIPHTH-ACELL PERTUSSIS 5-2.5-18.5 LF-MCG/0.5 IM SUSP: 0.5000 mL | Freq: Once | INTRAMUSCULAR | Status: DC

## 2013-06-17 MED ORDER — OXYCODONE-ACETAMINOPHEN 5-325 MG PO TABS: 1.0000 | ORAL_TABLET | ORAL | Status: DC | PRN

## 2013-06-17 MED ORDER — TERBUTALINE SULFATE 1 MG/ML IJ SOLN: 0.2500 mg | Freq: Once | INTRAMUSCULAR | Status: DC | PRN

## 2013-06-17 MED ORDER — SIMETHICONE 80 MG PO CHEW: 80.0000 mg | CHEWABLE_TABLET | ORAL | Status: DC | PRN

## 2013-06-17 MED ORDER — PHENYLEPHRINE 40 MCG/ML (10ML) SYRINGE FOR IV PUSH (FOR BLOOD PRESSURE SUPPORT)
80.0000 ug | PREFILLED_SYRINGE | INTRAVENOUS | Status: DC | PRN
  Filled 2013-06-17: qty 2

## 2013-06-17 MED ORDER — PHENYLEPHRINE 40 MCG/ML (10ML) SYRINGE FOR IV PUSH (FOR BLOOD PRESSURE SUPPORT)
PREFILLED_SYRINGE | INTRAVENOUS | Status: AC
  Filled 2013-06-17: qty 5

## 2013-06-17 MED ORDER — FENTANYL 2.5 MCG/ML BUPIVACAINE 1/10 % EPIDURAL INFUSION (WH - ANES): 14.0000 mL/h | INTRAMUSCULAR | Status: DC | PRN

## 2013-06-17 MED ORDER — OXYTOCIN 40 UNITS IN LACTATED RINGERS INFUSION - SIMPLE MED
1.0000 m[IU]/min | INTRAVENOUS | Status: DC
  Administered 2013-06-17: 1 m[IU]/min via INTRAVENOUS
  Administered 2013-06-17: 4 m[IU]/min via INTRAVENOUS

## 2013-06-17 MED ORDER — EPHEDRINE 5 MG/ML INJ
10.0000 mg | INTRAVENOUS | Status: DC | PRN
  Filled 2013-06-17: qty 2

## 2013-06-17 MED ORDER — LEVOTHYROXINE SODIUM 50 MCG PO TABS: 50.0000 ug | ORAL_TABLET | Freq: Every day | ORAL | Status: DC

## 2013-06-17 MED ORDER — ZOLPIDEM TARTRATE 5 MG PO TABS: 5.0000 mg | ORAL_TABLET | Freq: Every evening | ORAL | Status: DC | PRN

## 2013-06-17 MED ORDER — OXYTOCIN BOLUS FROM INFUSION: 500.0000 mL | INTRAVENOUS | Status: DC

## 2013-06-17 MED ORDER — OXYTOCIN 40 UNITS IN LACTATED RINGERS INFUSION - SIMPLE MED
62.5000 mL/h | INTRAVENOUS | Status: DC
  Filled 2013-06-17: qty 1000

## 2013-06-17 MED ORDER — LACTATED RINGERS IV SOLN: 500.0000 mL | Freq: Once | INTRAVENOUS | Status: DC

## 2013-06-17 MED ORDER — DIBUCAINE 1 % RE OINT: 1.0000 "application " | TOPICAL_OINTMENT | RECTAL | Status: DC | PRN

## 2013-06-17 MED ORDER — LANOLIN HYDROUS EX OINT: TOPICAL_OINTMENT | CUTANEOUS | Status: DC | PRN

## 2013-06-17 MED ORDER — BENZOCAINE-MENTHOL 20-0.5 % EX AERO
1.0000 "application " | INHALATION_SPRAY | CUTANEOUS | Status: DC | PRN
  Filled 2013-06-17 (×2): qty 56

## 2013-06-17 MED ORDER — CITRIC ACID-SODIUM CITRATE 334-500 MG/5ML PO SOLN: 30.0000 mL | ORAL | Status: DC | PRN

## 2013-06-17 MED ORDER — BISACODYL 10 MG RE SUPP: 10.0000 mg | Freq: Every day | RECTAL | Status: DC | PRN

## 2013-06-17 MED ORDER — FENTANYL 2.5 MCG/ML BUPIVACAINE 1/10 % EPIDURAL INFUSION (WH - ANES)
INTRAMUSCULAR | Status: DC | PRN
  Administered 2013-06-17: 13 mL/h via EPIDURAL

## 2013-06-17 MED ORDER — ONDANSETRON HCL 4 MG/2ML IJ SOLN: 4.0000 mg | INTRAMUSCULAR | Status: DC | PRN

## 2013-06-17 MED ORDER — IBUPROFEN 600 MG PO TABS
600.0000 mg | ORAL_TABLET | Freq: Four times a day (QID) | ORAL | Status: DC
  Administered 2013-06-18 – 2013-06-19 (×6): 600 mg via ORAL
  Filled 2013-06-17 (×6): qty 1

## 2013-06-17 MED ORDER — DIPHENHYDRAMINE HCL 25 MG PO CAPS: 25.0000 mg | ORAL_CAPSULE | Freq: Four times a day (QID) | ORAL | Status: DC | PRN

## 2013-06-17 MED ORDER — DIPHENHYDRAMINE HCL 50 MG/ML IJ SOLN: 12.5000 mg | INTRAMUSCULAR | Status: DC | PRN

## 2013-06-17 MED ORDER — FENTANYL 2.5 MCG/ML BUPIVACAINE 1/10 % EPIDURAL INFUSION (WH - ANES)
INTRAMUSCULAR | Status: AC
  Filled 2013-06-17: qty 125

## 2013-06-17 MED ORDER — LACTATED RINGERS IV SOLN
500.0000 mL | INTRAVENOUS | Status: DC | PRN
  Administered 2013-06-17: 500 mL via INTRAVENOUS

## 2013-06-17 MED ORDER — LACTATED RINGERS IV SOLN
INTRAVENOUS | Status: DC
  Administered 2013-06-17 (×2): via INTRAVENOUS

## 2013-06-17 MED ORDER — IBUPROFEN 600 MG PO TABS
600.0000 mg | ORAL_TABLET | Freq: Four times a day (QID) | ORAL | Status: DC | PRN
  Administered 2013-06-17: 600 mg via ORAL
  Filled 2013-06-17: qty 1

## 2013-06-17 MED ORDER — FLEET ENEMA 7-19 GM/118ML RE ENEM: 1.0000 | ENEMA | Freq: Every day | RECTAL | Status: DC | PRN

## 2013-06-17 MED ORDER — WITCH HAZEL-GLYCERIN EX PADS: 1.0000 "application " | MEDICATED_PAD | CUTANEOUS | Status: DC | PRN

## 2013-06-17 MED ORDER — SENNOSIDES-DOCUSATE SODIUM 8.6-50 MG PO TABS
2.0000 | ORAL_TABLET | Freq: Every day | ORAL | Status: DC
  Administered 2013-06-18: 2 via ORAL

## 2013-06-17 MED ORDER — LIDOCAINE HCL (PF) 1 % IJ SOLN
INTRAMUSCULAR | Status: DC | PRN
  Administered 2013-06-17: 4 mL
  Administered 2013-06-17: 6 mL
  Administered 2013-06-17: 3 mL

## 2013-06-17 MED ORDER — LIDOCAINE HCL (PF) 1 % IJ SOLN
30.0000 mL | INTRAMUSCULAR | Status: DC | PRN
  Filled 2013-06-17 (×2): qty 30

## 2013-06-17 MED ORDER — ACETAMINOPHEN 325 MG PO TABS: 650.0000 mg | ORAL_TABLET | ORAL | Status: DC | PRN

## 2013-06-17 MED ORDER — ONDANSETRON HCL 4 MG PO TABS: 4.0000 mg | ORAL_TABLET | ORAL | Status: DC | PRN

## 2013-06-17 MED ORDER — FLEET ENEMA 7-19 GM/118ML RE ENEM: 1.0000 | ENEMA | RECTAL | Status: DC | PRN

## 2013-06-17 NOTE — Progress Notes (Signed)
Cx rim-no change FHT reactive UCs about q6-8 min  D/W patient and husband above and risks Will begin pitocin augmentation

## 2013-06-17 NOTE — Progress Notes (Signed)
34 1/7 weeks  Some back pain on/off Light pink with wiping,  No gushes of fluid, thin mucous when up to BR  VSS Afeb Uterus soft, NT  FHT reactive UCs irregular , mild  Results for orders placed during the hospital encounter of 06/15/13 (from the past 24 hour(s))  CBC     Status: Abnormal   Collection Time    06/17/13  5:50 AM      Result Value Range   WBC 12.1 (*) 4.0 - 10.5 K/uL   RBC 4.18  3.87 - 5.11 MIL/uL   Hemoglobin 11.2 (*) 12.0 - 15.0 g/dL   HCT 16.1 (*) 09.6 - 04.5 %   MCV 82.1  78.0 - 100.0 fL   MCH 26.8  26.0 - 34.0 pg   MCHC 32.7  30.0 - 36.0 g/dL   RDW 40.9  81.1 - 91.4 %   Platelets 183  150 - 400 K/uL   A: PPROM  P: D/W patient/husband     BPP today     Continue IV fluids/MBR/latency atb     Heating pad for back

## 2013-06-17 NOTE — Anesthesia Preprocedure Evaluation (Signed)
Anesthesia Evaluation  Patient identified by MRN, date of birth, ID band Patient awake    Reviewed: Allergy & Precautions, H&P , Patient's Chart, lab work & pertinent test results  Airway Mallampati: III TM Distance: >3 FB Neck ROM: Full    Dental no notable dental hx.    Pulmonary neg pulmonary ROS,  breath sounds clear to auscultation  Pulmonary exam normal       Cardiovascular negative cardio ROS  Rhythm:Regular Rate:Normal     Neuro/Psych negative neurological ROS  negative psych ROS   GI/Hepatic negative GI ROS, Neg liver ROS,   Endo/Other  Hypothyroidism   Renal/GU negative Renal ROS  negative genitourinary   Musculoskeletal negative musculoskeletal ROS (+)   Abdominal   Peds  Hematology negative hematology ROS (+)   Anesthesia Other Findings   Reproductive/Obstetrics (+) Pregnancy 34 Weeks PTL                           Anesthesia Physical Anesthesia Plan  ASA: II  Anesthesia Plan: Epidural   Post-op Pain Management:    Induction:   Airway Management Planned: Natural Airway  Additional Equipment:   Intra-op Plan:   Post-operative Plan:   Informed Consent: I have reviewed the patients History and Physical, chart, labs and discussed the procedure including the risks, benefits and alternatives for the proposed anesthesia with the patient or authorized representative who has indicated his/her understanding and acceptance.   Dental advisory given  Plan Discussed with: Anesthesiologist and Surgeon  Anesthesia Plan Comments:         Anesthesia Quick Evaluation

## 2013-06-17 NOTE — Progress Notes (Signed)
Delivery Note Epidural placed Straight cath for approx 250cc Crowning with good effort Thick perineal body-MLE D/W patient and husband in view of prematurity Lidocaine local MLE SVD VMI Apgars 8/9 Peds present Arterial pH and wt pending Placenta intact, 3 vessels to path Cx/vagina intact Second degree MLE repaired, rectum checked Patient stable in LDR Infant to nursery with neonatologist

## 2013-06-17 NOTE — Anesthesia Procedure Notes (Signed)
Epidural Patient location during procedure: OB Start time: 06/17/2013 6:12 PM  Staffing Anesthesiologist: Nefi Musich A. Performed by: anesthesiologist   Preanesthetic Checklist Completed: patient identified, site marked, surgical consent, pre-op evaluation, timeout performed, IV checked, risks and benefits discussed and monitors and equipment checked  Epidural Patient position: sitting Prep: site prepped and draped and DuraPrep Patient monitoring: continuous pulse ox and blood pressure Approach: midline Injection technique: LOR air  Needle:  Needle type: Tuohy  Needle gauge: 17 G Needle length: 9 cm and 9 Needle insertion depth: 4 cm Catheter type: closed end flexible Catheter size: 19 Gauge Catheter at skin depth: 9 cm Test dose: negative and Other  Assessment Events: blood not aspirated, injection not painful, no injection resistance, negative IV test and no paresthesia  Additional Notes Patient identified. Risks and benefits discussed including failed block, incomplete  Pain control, post dural puncture headache, nerve damage, paralysis, blood pressure Changes, nausea, vomiting, reactions to medications-both toxic and allergic and post Partum back pain. All questions were answered. Patient expressed understanding and wished to proceed. Sterile technique was used throughout procedure. Epidural site was Dressed with sterile barrier dressing. No paresthesias, signs of intravascular injection Or signs of intrathecal spread were encountered.  Patient was more comfortable after the epidural was dosed. Attempt x 2 due to poor position. Please see RN's note for documentation of vital signs and FHR which are stable.

## 2013-06-17 NOTE — Consult Note (Signed)
Neonatology Note:  Attendance at Delivery:  I was asked by Dr. Tomblin to attend this NSVD at 34 1/7 weeks following onset of preterm labor. The mother is a G1P0 B pos, GBS pending with hypothyroidism, on Synthroid. ROM 47 hours prior to delivery, fluid clear. Mother received Ampicillin and Zithromax > 4 hours before delivery and she remained afebrile. She progressed into labor today. At delivery, infant vigorous with good spontaneous cry and tone. Needed only minimal bulb suctioning. Ap 8/9. Pulse oximetry showed O2 saturations in room air were 96%. Baby was noted to be somewhat pale in the DR. Lungs clear to ausc in DR. He was held by his mother, then was transported to the NICU for further care, with his father in attendance.  Saje Gallop C. Eldwin Volkov, MD  

## 2013-06-17 NOTE — Progress Notes (Signed)
34 1/7 weeks Feeling some increased vaginal pressure Feeling contractions in low back, some stronger Seeing more blood  VSS Afeb Cx 7/C/-1 FHT excellent acels UCs about q10min  A: PPROM     Labor  P: Transfer to L&D

## 2013-06-18 LAB — CBC
HCT: 33.1 % — ABNORMAL LOW (ref 36.0–46.0)
MCHC: 32.9 g/dL (ref 30.0–36.0)
MCV: 81.3 fL (ref 78.0–100.0)
RDW: 15.2 % (ref 11.5–15.5)

## 2013-06-18 LAB — CULTURE, BETA STREP (GROUP B ONLY)

## 2013-06-18 MED ORDER — LEVOTHYROXINE SODIUM 50 MCG PO TABS
50.0000 ug | ORAL_TABLET | Freq: Every day | ORAL | Status: DC
  Filled 2013-06-18 (×2): qty 1

## 2013-06-18 NOTE — Anesthesia Postprocedure Evaluation (Signed)
Anesthesia Post Note  Patient: [name] [name]  Procedure(s) Performed: * No procedures listed *  Anesthesia type: Epidural  Patient location: Mother/Baby  Post pain: Pain level controlled  Post assessment: Post-op Vital signs reviewed  Last Vitals: BP 100/60  Pulse 100  Temp(Src) 36.5 C (Oral)  Resp 18  Ht 5\' 2"  (1.575 m)  Wt 160 lb (72.576 kg)  BMI 29.26 kg/m2  SpO2 100%  LMP 10/09/2012  Post vital signs: Reviewed  Level of consciousness: awake  Complications: No apparent anesthesia complications

## 2013-06-18 NOTE — Progress Notes (Signed)
Post Partum Day 1 Subjective: no complaints, up ad lib, voiding, tolerating PO and baby doing well in NICU. not on O2  Objective: Blood pressure 100/60, pulse 100, temperature 97.7 F (36.5 C), temperature source Oral, resp. rate 18, height 5\' 2"  (1.575 m), weight 160 lb (72.576 kg), last menstrual period 10/09/2012, SpO2 100.00%, unknown if currently breastfeeding.  Physical Exam:  General: alert and cooperative Lochia: appropriate Uterine Fundus: firm Incision: perineum intact DVT Evaluation: No evidence of DVT seen on physical exam. Negative Homan's sign. No cords or calf tenderness. No significant calf/ankle edema.   Recent Labs  06/17/13 1643 06/18/13 0512  HGB 11.7* 10.9*  HCT 36.1 33.1*    Assessment/Plan: Plan for discharge tomorrow and Lactation consult   LOS: 3 days   [name] [name] G 06/18/2013, 8:29 AM

## 2013-06-18 NOTE — Progress Notes (Signed)
UR chart review completed.  

## 2013-06-19 ENCOUNTER — Encounter (HOSPITAL_COMMUNITY)
Admission: RE | Admit: 2013-06-19 | Discharge: 2013-06-19 | Disposition: A | Discharge status: Home / Self Care | Payer: BC Managed Care – PPO | Source: Ambulatory Visit

## 2013-06-19 DIAGNOSIS — O923 Agalactia: Secondary | ICD-10-CM | POA: Insufficient documentation

## 2013-06-19 MED ORDER — IBUPROFEN 600 MG PO TABS: 600.0000 mg | ORAL_TABLET | Freq: Four times a day (QID) | ORAL | Status: DC

## 2013-06-19 NOTE — Discharge Summary (Signed)
Obstetric Discharge Summary Reason for Admission: rupture of membranes Prenatal Procedures: ultrasound Intrapartum Procedures: spontaneous vaginal delivery Postpartum Procedures: none Complications-Operative and Postpartum: none Hemoglobin  Date Value Range Status  06/18/2013 10.9* 12.0 - 15.0 g/dL Final     HCT  Date Value Range Status  06/18/2013 33.1* 36.0 - 46.0 % Final    Physical Exam:  General: alert and cooperative Lochia: appropriate Uterine Fundus: firm Incision: n/a DVT Evaluation: No evidence of DVT seen on physical exam.  Discharge Diagnoses: PPROM, PTD  Discharge Information: Date: 06/19/2013 Activity: unrestricted, pelvic rest Diet: routine Medications: PNV and Ibuprofen Condition: stable Instructions: refer to practice specific booklet Discharge to: home Follow-up Information   Schedule an appointment as soon as possible for a visit in 6 weeks to follow up.      Newborn Data: Live born female  Birth Weight:  APGAR: 8, 9  Baby in NICU  [name] [name] 06/19/2013, 8:49 AM

## 2013-06-19 NOTE — Progress Notes (Signed)
Discharge instructions provided to patient and significant o there at bedside.  Medications, activities, when to call the doctor, follow up appointments and community resources discussed.  No questions at this time.  Patient rented pump from lactation department.  Patient left unit with all personal belongings in stable condition accompanied by staff.  Osvaldo Angst, RN-----------------

## 2013-06-19 NOTE — Progress Notes (Signed)
06/19/13 1400  Clinical Encounter Type  Visited With Patient and family together (husband)  Visit Type Initial;Spiritual support;Social support  Referral From Nurse  Spiritual Encounters  Spiritual Needs Emotional  Stress Factors  Family Stress Factors Major life changes (in process of moving; dad's parents arrive from [place] today)   Met pt and husband on their way out upon discharge, introducing spiritual care and chaplain availability.  They were receptive and enthusiastic, especially given all their stressors:  They are currently moving to a new home, and husband's parents are arriving in [place] from [place] later today.  Mom is tearful and appreciative of encouragement and hug.  They ask spiritual care to "keep baby and [place] in your prayers" and plan to follow up once they get a little settled.  They have an immediate self-care plan in place as well:  Friends have invited them over for homecooked meal.  Spiritual Care will continue to follow for support.  [address] [place], [place] [phone]

## 2013-06-19 NOTE — Progress Notes (Signed)
CSW attempted twice to meet with parents to complete assessment due to NICU admission, but they were not in their room at this time.  CSW to attempt again at a later time.

## 2014-05-16 IMAGING — US US FETAL BPP W/O NONSTRESS
1 series · 9 of 9 positions shown · non-contrast
Comparison: none

CLINICAL DATA: Premature rupture of membranes

[Series 1: us fetal bpp w/o nonstress · non-contrast · 9 acquisitions, 9 frames shown]
[im 1/9]
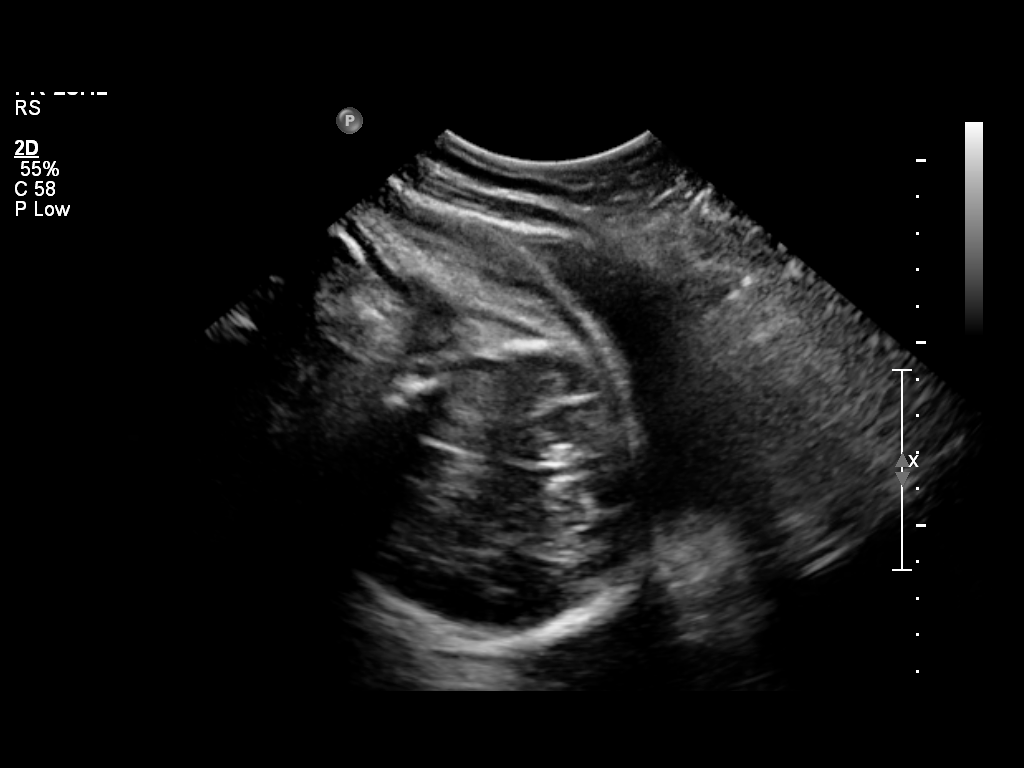
[im 2/9]
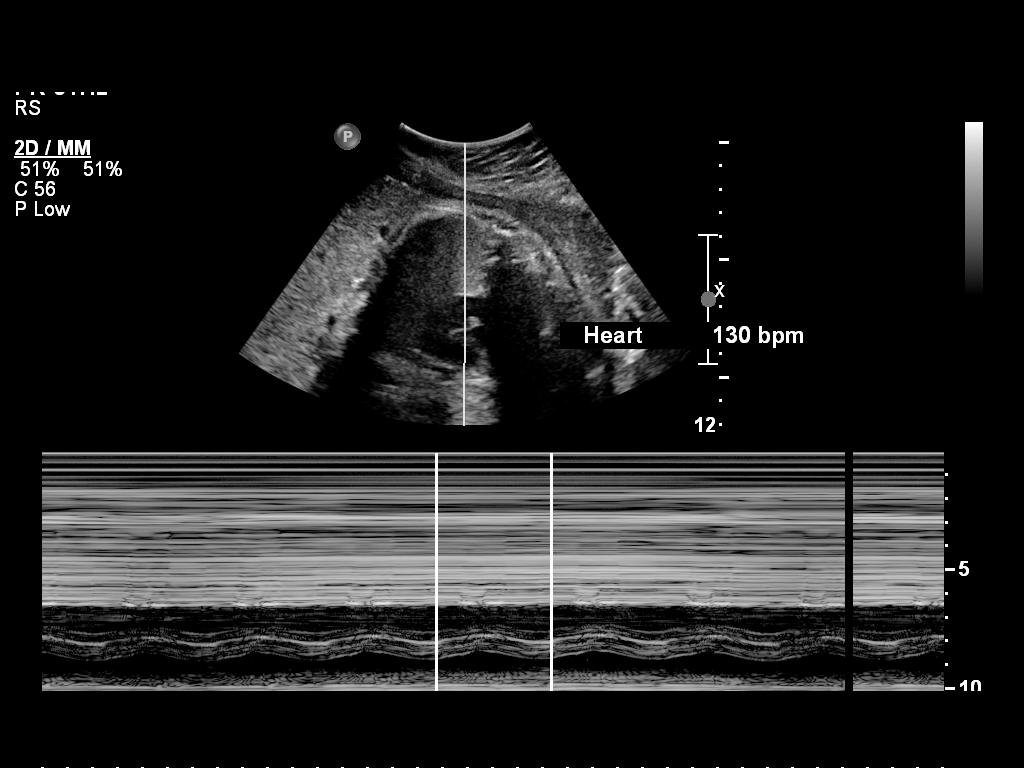
[im 3/9]
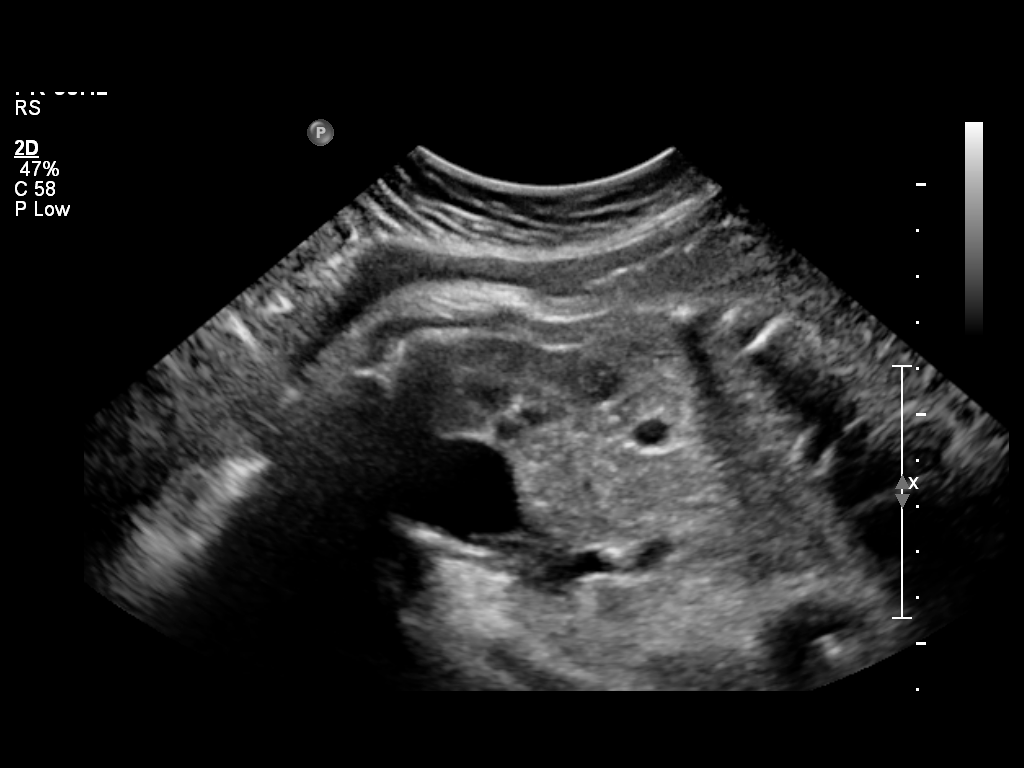
[im 4/9]
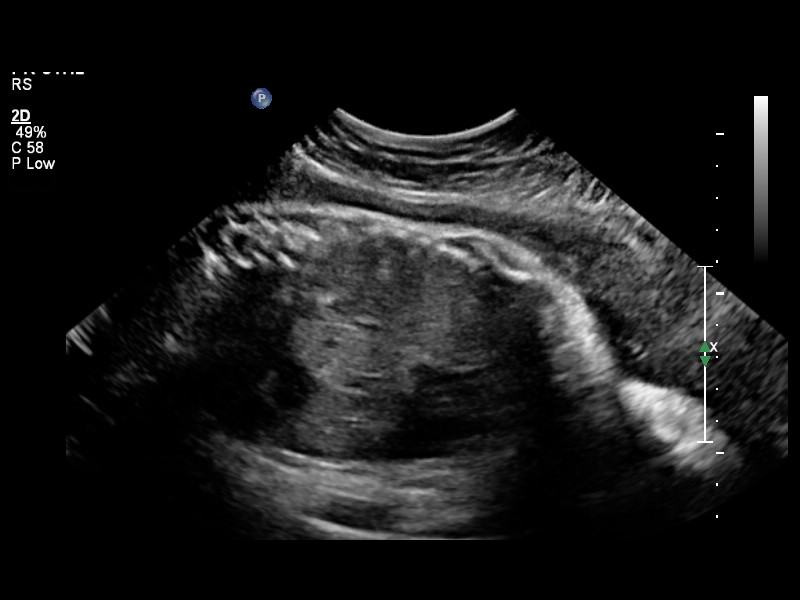
[im 5/9]
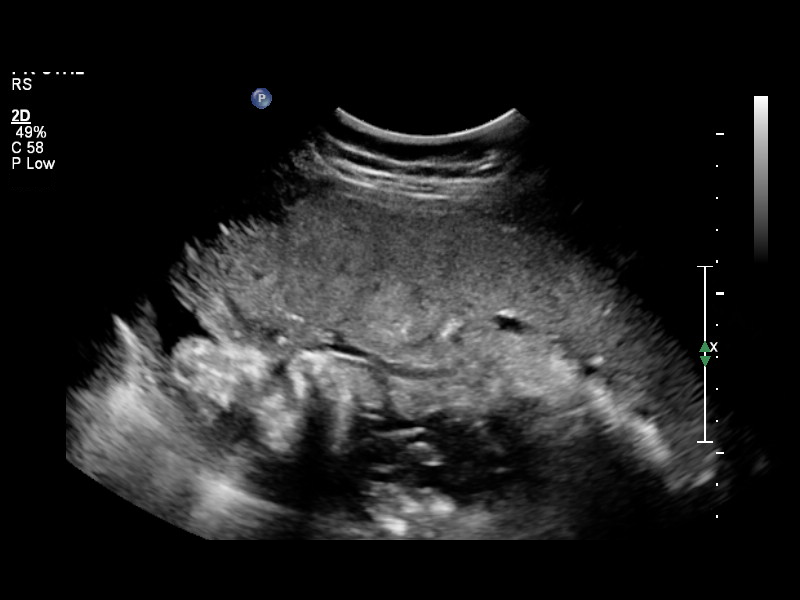
[im 6/9]
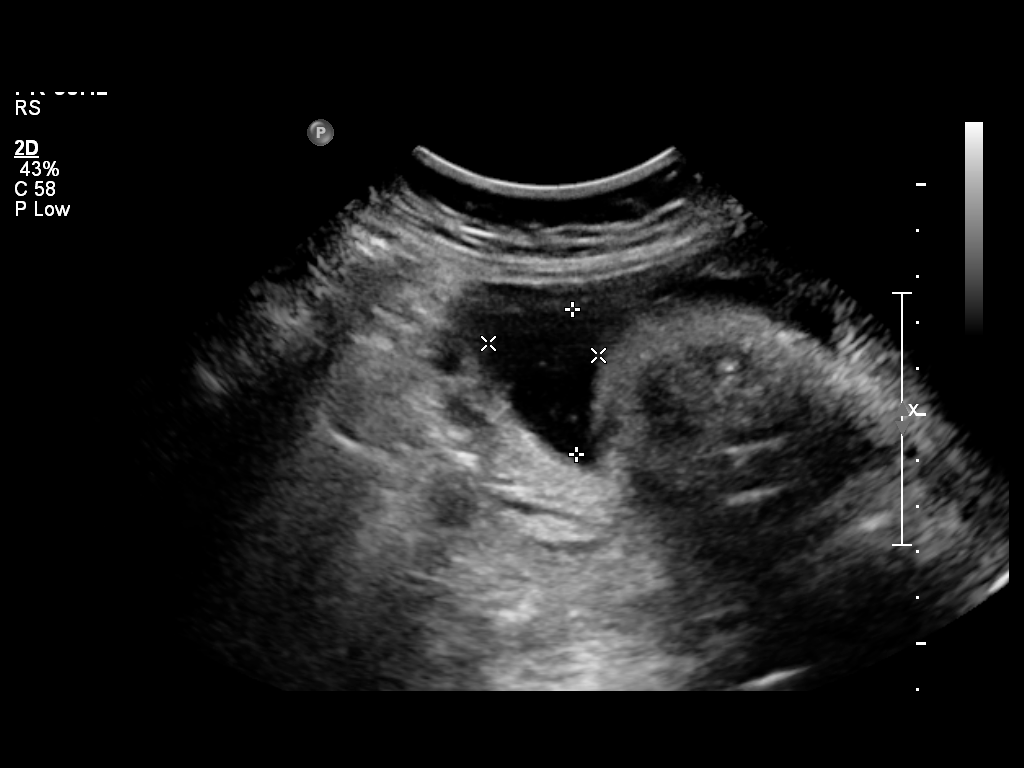
[im 7/9]
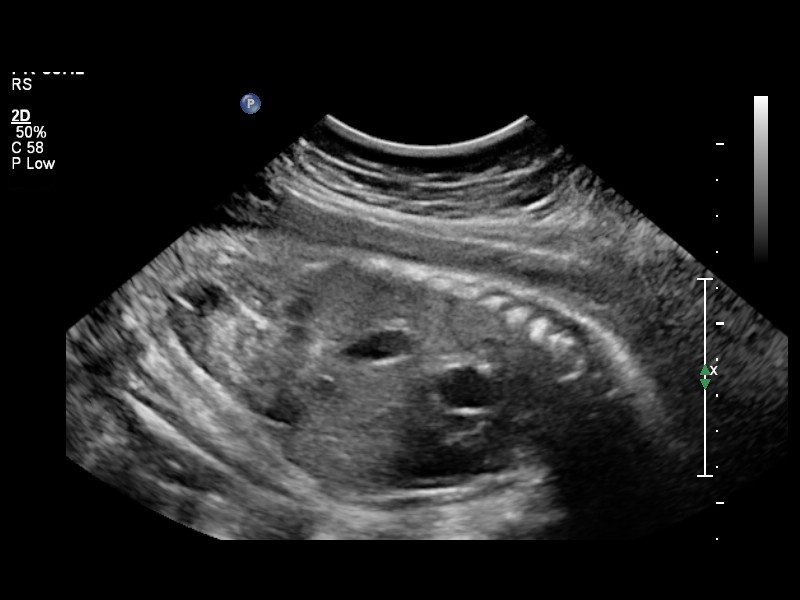
[im 8/9]
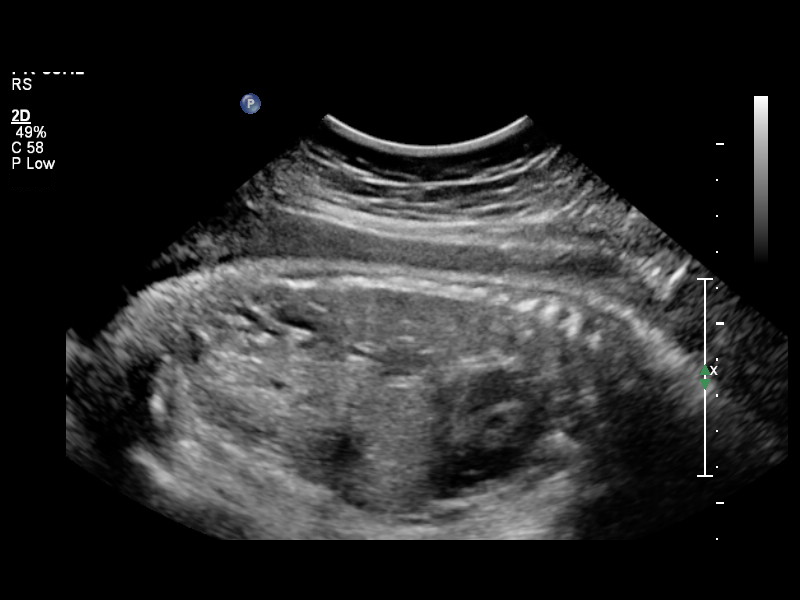
[im 9/9]
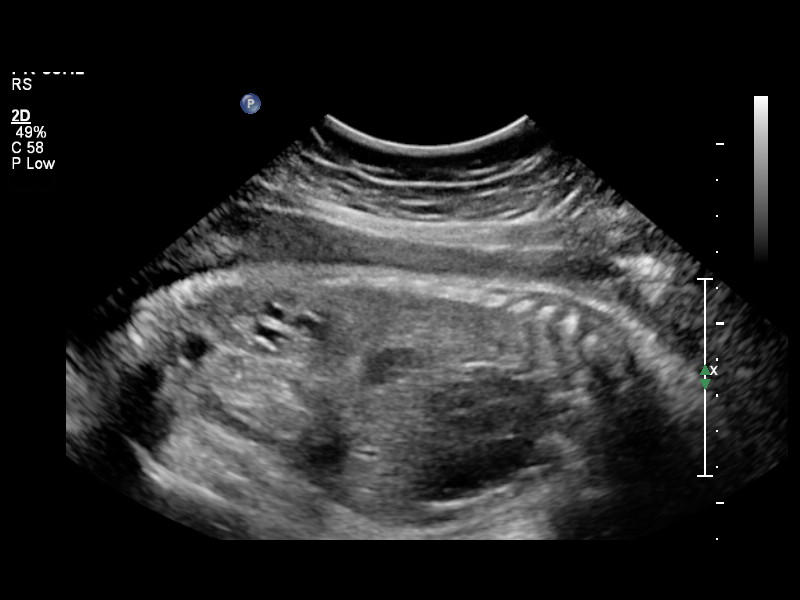

[9 of 9 positions shown; findings below may reference images not displayed]

LIMITED OBSTETRIC ULTRASOUND
BIOPHYSICAL PROFILE

Number of Fetuses: 1
Heart Rate: 959bpm
Movement:  Present
Presentation: Cephalic
Placental Location: Anterior
Previa: Absent
Amniotic Fluid (Subjective): Decreased

Movement: 2                   Time: 30 minutes
Breathing: 2
Tone: 2
Amniotic Fluid: 2

Total Score: [DATE]
IMPRESSION: Fetal heart rate 130 bpm.

Subjectively decreased amniotic fluid.

Biophysical profile [DATE].

## 2014-09-30 ENCOUNTER — Encounter (HOSPITAL_COMMUNITY): Payer: Self-pay | Admitting: Anesthesiology

## 2016-09-22 DIAGNOSIS — M9903 Segmental and somatic dysfunction of lumbar region: Secondary | ICD-10-CM | POA: Diagnosis not present

## 2016-09-22 DIAGNOSIS — M62830 Muscle spasm of back: Secondary | ICD-10-CM | POA: Diagnosis not present

## 2016-09-22 DIAGNOSIS — M5136 Other intervertebral disc degeneration, lumbar region: Secondary | ICD-10-CM | POA: Diagnosis not present

## 2016-09-22 DIAGNOSIS — M545 Low back pain: Secondary | ICD-10-CM | POA: Diagnosis not present

## 2016-09-23 DIAGNOSIS — M62830 Muscle spasm of back: Secondary | ICD-10-CM | POA: Diagnosis not present

## 2016-09-23 DIAGNOSIS — M545 Low back pain: Secondary | ICD-10-CM | POA: Diagnosis not present

## 2016-09-23 DIAGNOSIS — M9903 Segmental and somatic dysfunction of lumbar region: Secondary | ICD-10-CM | POA: Diagnosis not present

## 2016-09-23 DIAGNOSIS — M5136 Other intervertebral disc degeneration, lumbar region: Secondary | ICD-10-CM | POA: Diagnosis not present

## 2016-09-25 DIAGNOSIS — M62830 Muscle spasm of back: Secondary | ICD-10-CM | POA: Diagnosis not present

## 2016-09-25 DIAGNOSIS — M9903 Segmental and somatic dysfunction of lumbar region: Secondary | ICD-10-CM | POA: Diagnosis not present

## 2016-09-25 DIAGNOSIS — M545 Low back pain: Secondary | ICD-10-CM | POA: Diagnosis not present

## 2016-09-25 DIAGNOSIS — M5136 Other intervertebral disc degeneration, lumbar region: Secondary | ICD-10-CM | POA: Diagnosis not present

## 2016-09-28 DIAGNOSIS — M545 Low back pain: Secondary | ICD-10-CM | POA: Diagnosis not present

## 2016-09-28 DIAGNOSIS — M5136 Other intervertebral disc degeneration, lumbar region: Secondary | ICD-10-CM | POA: Diagnosis not present

## 2016-09-28 DIAGNOSIS — M62830 Muscle spasm of back: Secondary | ICD-10-CM | POA: Diagnosis not present

## 2016-09-28 DIAGNOSIS — M9903 Segmental and somatic dysfunction of lumbar region: Secondary | ICD-10-CM | POA: Diagnosis not present

## 2016-09-29 DIAGNOSIS — M545 Low back pain: Secondary | ICD-10-CM | POA: Diagnosis not present

## 2016-09-29 DIAGNOSIS — M5136 Other intervertebral disc degeneration, lumbar region: Secondary | ICD-10-CM | POA: Diagnosis not present

## 2016-09-29 DIAGNOSIS — M62830 Muscle spasm of back: Secondary | ICD-10-CM | POA: Diagnosis not present

## 2016-09-29 DIAGNOSIS — M9903 Segmental and somatic dysfunction of lumbar region: Secondary | ICD-10-CM | POA: Diagnosis not present

## 2016-10-05 DIAGNOSIS — M62830 Muscle spasm of back: Secondary | ICD-10-CM | POA: Diagnosis not present

## 2016-10-05 DIAGNOSIS — M5136 Other intervertebral disc degeneration, lumbar region: Secondary | ICD-10-CM | POA: Diagnosis not present

## 2016-10-05 DIAGNOSIS — M9903 Segmental and somatic dysfunction of lumbar region: Secondary | ICD-10-CM | POA: Diagnosis not present

## 2016-10-05 DIAGNOSIS — M545 Low back pain: Secondary | ICD-10-CM | POA: Diagnosis not present

## 2016-10-09 DIAGNOSIS — M62830 Muscle spasm of back: Secondary | ICD-10-CM | POA: Diagnosis not present

## 2016-10-09 DIAGNOSIS — M9903 Segmental and somatic dysfunction of lumbar region: Secondary | ICD-10-CM | POA: Diagnosis not present

## 2016-10-09 DIAGNOSIS — M5136 Other intervertebral disc degeneration, lumbar region: Secondary | ICD-10-CM | POA: Diagnosis not present

## 2016-10-09 DIAGNOSIS — M545 Low back pain: Secondary | ICD-10-CM | POA: Diagnosis not present

## 2016-10-13 DIAGNOSIS — M5136 Other intervertebral disc degeneration, lumbar region: Secondary | ICD-10-CM | POA: Diagnosis not present

## 2016-10-13 DIAGNOSIS — M62830 Muscle spasm of back: Secondary | ICD-10-CM | POA: Diagnosis not present

## 2016-10-13 DIAGNOSIS — M9903 Segmental and somatic dysfunction of lumbar region: Secondary | ICD-10-CM | POA: Diagnosis not present

## 2016-10-13 DIAGNOSIS — M545 Low back pain: Secondary | ICD-10-CM | POA: Diagnosis not present

## 2016-10-14 DIAGNOSIS — M5136 Other intervertebral disc degeneration, lumbar region: Secondary | ICD-10-CM | POA: Diagnosis not present

## 2016-10-14 DIAGNOSIS — M62830 Muscle spasm of back: Secondary | ICD-10-CM | POA: Diagnosis not present

## 2016-10-14 DIAGNOSIS — M9903 Segmental and somatic dysfunction of lumbar region: Secondary | ICD-10-CM | POA: Diagnosis not present

## 2016-10-14 DIAGNOSIS — M545 Low back pain: Secondary | ICD-10-CM | POA: Diagnosis not present

## 2016-10-19 DIAGNOSIS — M62830 Muscle spasm of back: Secondary | ICD-10-CM | POA: Diagnosis not present

## 2016-10-19 DIAGNOSIS — M9903 Segmental and somatic dysfunction of lumbar region: Secondary | ICD-10-CM | POA: Diagnosis not present

## 2016-10-19 DIAGNOSIS — M5136 Other intervertebral disc degeneration, lumbar region: Secondary | ICD-10-CM | POA: Diagnosis not present

## 2016-10-19 DIAGNOSIS — M545 Low back pain: Secondary | ICD-10-CM | POA: Diagnosis not present

## 2016-10-20 DIAGNOSIS — M545 Low back pain: Secondary | ICD-10-CM | POA: Diagnosis not present

## 2016-10-20 DIAGNOSIS — M5136 Other intervertebral disc degeneration, lumbar region: Secondary | ICD-10-CM | POA: Diagnosis not present

## 2016-10-20 DIAGNOSIS — M9903 Segmental and somatic dysfunction of lumbar region: Secondary | ICD-10-CM | POA: Diagnosis not present

## 2016-10-20 DIAGNOSIS — M62830 Muscle spasm of back: Secondary | ICD-10-CM | POA: Diagnosis not present

## 2016-11-09 DIAGNOSIS — M5127 Other intervertebral disc displacement, lumbosacral region: Secondary | ICD-10-CM | POA: Diagnosis not present

## 2016-11-09 DIAGNOSIS — M47817 Spondylosis without myelopathy or radiculopathy, lumbosacral region: Secondary | ICD-10-CM | POA: Diagnosis not present

## 2016-11-23 DIAGNOSIS — M5431 Sciatica, right side: Secondary | ICD-10-CM | POA: Diagnosis not present

## 2016-12-01 DIAGNOSIS — E039 Hypothyroidism, unspecified: Secondary | ICD-10-CM | POA: Diagnosis not present

## 2016-12-08 DIAGNOSIS — E039 Hypothyroidism, unspecified: Secondary | ICD-10-CM | POA: Diagnosis not present

## 2017-01-11 DIAGNOSIS — J111 Influenza due to unidentified influenza virus with other respiratory manifestations: Secondary | ICD-10-CM | POA: Diagnosis not present

## 2017-01-11 DIAGNOSIS — R0981 Nasal congestion: Secondary | ICD-10-CM | POA: Diagnosis not present

## 2017-05-11 DIAGNOSIS — Z0000 Encounter for general adult medical examination without abnormal findings: Secondary | ICD-10-CM | POA: Diagnosis not present

## 2017-08-08 DIAGNOSIS — E039 Hypothyroidism, unspecified: Secondary | ICD-10-CM | POA: Diagnosis not present

## 2017-08-23 DIAGNOSIS — E039 Hypothyroidism, unspecified: Secondary | ICD-10-CM | POA: Diagnosis not present

## 2017-10-17 DIAGNOSIS — E039 Hypothyroidism, unspecified: Secondary | ICD-10-CM | POA: Diagnosis not present

## 2018-02-22 DIAGNOSIS — E039 Hypothyroidism, unspecified: Secondary | ICD-10-CM | POA: Diagnosis not present

## 2018-02-22 DIAGNOSIS — M5431 Sciatica, right side: Secondary | ICD-10-CM | POA: Diagnosis not present

## 2018-05-26 DIAGNOSIS — E039 Hypothyroidism, unspecified: Secondary | ICD-10-CM | POA: Diagnosis not present

## 2018-08-01 ENCOUNTER — Inpatient Hospital Stay (HOSPITAL_COMMUNITY)
Admission: AD | Admit: 2018-08-01 | Discharge: 2018-08-01 | Disposition: A | Discharge status: Home / Self Care | Payer: BLUE CROSS/BLUE SHIELD | Source: Ambulatory Visit | Attending: Obstetrics and Gynecology | Admitting: Obstetrics and Gynecology

## 2018-08-01 ENCOUNTER — Inpatient Hospital Stay (HOSPITAL_COMMUNITY): Payer: BLUE CROSS/BLUE SHIELD

## 2018-08-01 ENCOUNTER — Encounter (HOSPITAL_COMMUNITY): Payer: Self-pay

## 2018-08-01 DIAGNOSIS — O09521 Supervision of elderly multigravida, first trimester: Secondary | ICD-10-CM | POA: Diagnosis not present

## 2018-08-01 DIAGNOSIS — Z3A01 Less than 8 weeks gestation of pregnancy: Secondary | ICD-10-CM | POA: Insufficient documentation

## 2018-08-01 DIAGNOSIS — Z3A09 9 weeks gestation of pregnancy: Secondary | ICD-10-CM | POA: Diagnosis not present

## 2018-08-01 DIAGNOSIS — O209 Hemorrhage in early pregnancy, unspecified: Secondary | ICD-10-CM

## 2018-08-01 DIAGNOSIS — Z3481 Encounter for supervision of other normal pregnancy, first trimester: Secondary | ICD-10-CM

## 2018-08-01 HISTORY — DX: Other specified health status: Z78.9

## 2018-08-01 LAB — URINALYSIS, ROUTINE W REFLEX MICROSCOPIC
Bilirubin Urine: NEGATIVE
GLUCOSE, UA: NEGATIVE mg/dL
Ketones, ur: NEGATIVE mg/dL
NITRITE: NEGATIVE
Protein, ur: NEGATIVE mg/dL
SPECIFIC GRAVITY, URINE: 1.003 — AB (ref 1.005–1.030)
pH: 6 (ref 5.0–8.0)

## 2018-08-01 LAB — WET PREP, GENITAL
CLUE CELLS WET PREP: NONE SEEN
Sperm: NONE SEEN
Trich, Wet Prep: NONE SEEN
Yeast Wet Prep HPF POC: NONE SEEN

## 2018-08-01 LAB — CBC
HEMATOCRIT: 36.2 % (ref 36.0–46.0)
HEMOGLOBIN: 11.8 g/dL — AB (ref 12.0–15.0)
MCH: 26.5 pg (ref 26.0–34.0)
MCHC: 32.6 g/dL (ref 30.0–36.0)
MCV: 81.3 fL (ref 78.0–100.0)
Platelets: 241 10*3/uL (ref 150–400)
RBC: 4.45 MIL/uL (ref 3.87–5.11)
RDW: 15.4 % (ref 11.5–15.5)
WBC: 8.7 10*3/uL (ref 4.0–10.5)

## 2018-08-01 LAB — HCG, QUANTITATIVE, PREGNANCY: HCG, BETA CHAIN, QUANT, S: 33504 m[IU]/mL — AB (ref <5)

## 2018-08-01 LAB — ABO/RH: ABO/RH(D): B POS

## 2018-08-01 LAB — POCT PREGNANCY, URINE: PREG TEST UR: POSITIVE — AB

## 2018-08-01 LAB — TYPE AND SCREEN
ABO/RH(D): B POS
ANTIBODY SCREEN: NEGATIVE

## 2018-08-01 MED ORDER — PNV-DHA 27-0.6-0.4-300 MG PO CAPS: 1.0000 | ORAL_CAPSULE | Freq: Every day | ORAL | 3 refills | Status: AC

## 2018-08-01 NOTE — Discharge Instructions (Signed)

## 2018-08-01 NOTE — MAU Note (Signed)
Pt stated she had some spotting this morning, none since. Some pulling feeling in her abdomen. Not yet had an OB appointment.

## 2018-08-01 NOTE — MAU Provider Note (Signed)
History     CSN: [id number]  Arrival date and time: 08/01/18 1521   First Provider Initiated Contact with Patient 08/01/18 1554      Chief Complaint  Patient presents with  . Vaginal Bleeding   HPI  [name] [name] is a 37 y.o. G2P0101 at [redacted]w[redacted]d who presents to MAU with chief complaint of vaginal bleeding. Patient states she saw a smear of pink-tinged vaginal discharge when she wiped at 10:15am today. Denies abdominal pain, abnormal vaginal discharge, fever, falls.  OB History    Gravida  2   Para  1   Term      Preterm  1   AB      Living  1     SAB      TAB      Ectopic      Multiple      Live Births  1           Past Medical History:  Diagnosis Date  . Medical history non-contributory     Past Surgical History:  Procedure Laterality Date  . APPENDECTOMY      No family history on file.  Social History   Tobacco Use  . Smoking status: Never Smoker  . Smokeless tobacco: Never Used  Substance Use Topics  . Alcohol use: Not Currently  . Drug use: Never    Allergies: No Known Allergies  Medications Prior to Admission  Medication Sig Dispense Refill Last Dose  . levothyroxine (SYNTHROID, LEVOTHROID) 50 MCG tablet    08/01/2018 at Unknown time    Review of Systems  Constitutional: Negative for fatigue.  Respiratory: Negative for shortness of breath.   Gastrointestinal: Negative for abdominal pain, nausea and vomiting.  Genitourinary: Positive for vaginal bleeding and vaginal discharge. Negative for vaginal pain.  Neurological: Negative for headaches.  All other systems reviewed and are negative.  Physical Exam   Blood pressure 110/75, pulse (!) 105, temperature 98.3 F (36.8 C), temperature source Oral, resp. rate 16, weight 61.2 kg, last menstrual period 05/30/2018.  Physical Exam  Nursing note and vitals reviewed. Constitutional: She is oriented to person, place, and time. She appears well-developed and well-nourished.   Cardiovascular: Normal rate and intact distal pulses.  Respiratory: Effort normal. No respiratory distress.  GI: Soft. She exhibits no distension. There is no tenderness. There is no rebound.  Genitourinary: Vagina normal and uterus normal.  Neurological: She is alert and oriented to person, place, and time. She has normal reflexes.  Skin: Skin is warm and dry.  Psychiatric: She has a normal mood and affect. Her behavior is normal. Judgment and thought content normal.    MAU Course  Procedures  MDM --scant vaginal discharge on PE, no other concerning items or findings  Patient Vitals for the past 24 hrs:  BP Temp Temp src Pulse Resp Weight  08/01/18 1548 110/75 98.3 F (36.8 C) Oral (!) 105 16 -  08/01/18 1543 - - - - - 61.2 kg    Orders Placed This Encounter  Procedures  . Wet prep, genital  . US OB LESS THAN 14 WEEKS WITH OB TRANSVAGINAL  . Urinalysis, Routine w reflex microscopic  . CBC  . hCG, quantitative, pregnancy  . Pregnancy, urine POC  . Type and screen [facility]  . ABO/Rh  . Discharge patient   Results for orders placed or performed during the hospital encounter of 08/01/18 (from the past 24 hour(s))  Urinalysis, Routine w reflex microscopic  Status: Abnormal   Collection Time: 08/01/18  3:42 PM  Result Value Ref Range   Color, Urine COLORLESS (A) YELLOW   APPearance CLEAR CLEAR   Specific Gravity, Urine 1.003 (L) 1.005 - 1.030   pH 6.0 5.0 - 8.0   Glucose, UA NEGATIVE NEGATIVE mg/dL   Hgb urine dipstick SMALL (A) NEGATIVE   Bilirubin Urine NEGATIVE NEGATIVE   Ketones, ur NEGATIVE NEGATIVE mg/dL   Protein, ur NEGATIVE NEGATIVE mg/dL   Nitrite NEGATIVE NEGATIVE   Leukocytes, UA TRACE (A) NEGATIVE   RBC / HPF 0-5 0 - 5 RBC/hpf   WBC, UA 0-5 0 - 5 WBC/hpf   Bacteria, UA RARE (A) NONE SEEN   Squamous Epithelial / LPF 0-5 0 - 5   Mucus PRESENT   Pregnancy, urine POC     Status: Abnormal   Collection Time: 08/01/18  3:46 PM  Result  Value Ref Range   Preg Test, Ur POSITIVE (A) NEGATIVE  Wet prep, genital     Status: Abnormal   Collection Time: 08/01/18  4:02 PM  Result Value Ref Range   Yeast Wet Prep HPF POC NONE SEEN NONE SEEN   Trich, Wet Prep NONE SEEN NONE SEEN   Clue Cells Wet Prep HPF POC NONE SEEN NONE SEEN   WBC, Wet Prep HPF POC FEW (A) NONE SEEN   Sperm NONE SEEN   CBC     Status: Abnormal   Collection Time: 08/01/18  4:14 PM  Result Value Ref Range   WBC 8.7 4.0 - 10.5 K/uL   RBC 4.45 3.87 - 5.11 MIL/uL   Hemoglobin 11.8 (L) 12.0 - 15.0 g/dL   HCT 81.1 91.4 - 78.2 %   MCV 81.3 78.0 - 100.0 fL   MCH 26.5 26.0 - 34.0 pg   MCHC 32.6 30.0 - 36.0 g/dL   RDW 95.6 21.3 - 08.6 %   Platelets 241 150 - 400 K/uL  Type and screen [facility] [place]     Status: None   Collection Time: 08/01/18  4:14 PM  Result Value Ref Range   ABO/RH(D) B POS    Antibody Screen NEG    Sample Expiration      08/04/2018 Performed at [facility], [address] [place], [place] [address]   hCG, quantitative, pregnancy     Status: Abnormal   Collection Time: 08/01/18  4:14 PM  Result Value Ref Range   hCG, Beta Chain, Quant, S 33,504 (H) <5 mIU/mL   US Ob Less Than 14 Weeks With Ob Transvaginal  Result Date: 08/01/2018 CLINICAL DATA:  Vaginal bleeding EXAM: OBSTETRIC <14 WK [place] AND TRANSVAGINAL OB US TECHNIQUE: Both transabdominal and transvaginal ultrasound examinations were performed for complete evaluation of the gestation as well as the maternal uterus, adnexal regions, and pelvic cul-de-sac. Transvaginal technique was performed to assess early pregnancy. COMPARISON:  None. FINDINGS: Intrauterine gestational sac: Single intrauterine pregnancy Yolk sac:  Visible Embryo:  Visible Cardiac Activity: Visible Heart Rate: 98 bpm CRL: 2.9 mm   5 w   5 d                  [place] EDC: 03/29/2019 Subchorionic hemorrhage:  None visualized. Maternal uterus/adnexae: Ovaries are within normal limits. The left ovary measures 3.4  x 2.2 by 2 cm. The right ovary measures 3.2 x 3.7 x 2.7 cm and contains a corpus luteal body. No significant free fluid IMPRESSION: Single viable intrauterine pregnancy as above. Electronically Signed   By: Selena Batten  Jake Samples M.D.   On: 08/01/2018 18:03    Assessment and Plan  --37 y.o. G2P0101 at 5w 5d by [place] today --Reviewed change in dating based on accuracy of first trimester [place] --Rx PNV sent to patient pharmacy, given safe medications handout --Discharge home in stable condition  F/u: patient plans to establish care with Collene Schlichter, CNM 08/01/2018, 6:39 PM

## 2018-08-02 ENCOUNTER — Encounter (HOSPITAL_COMMUNITY): Payer: Self-pay

## 2018-08-02 ENCOUNTER — Encounter (HOSPITAL_COMMUNITY): Payer: Self-pay | Admitting: Anesthesiology

## 2018-08-02 LAB — GC/CHLAMYDIA PROBE AMP (~~LOC~~) NOT AT ARMC
Chlamydia: NEGATIVE
Neisseria Gonorrhea: NEGATIVE

## 2018-08-03 DIAGNOSIS — E039 Hypothyroidism, unspecified: Secondary | ICD-10-CM | POA: Diagnosis not present

## 2018-08-08 DIAGNOSIS — Z3481 Encounter for supervision of other normal pregnancy, first trimester: Secondary | ICD-10-CM | POA: Diagnosis not present

## 2018-08-08 DIAGNOSIS — Z3480 Encounter for supervision of other normal pregnancy, unspecified trimester: Secondary | ICD-10-CM | POA: Diagnosis not present

## 2018-08-08 DIAGNOSIS — Z3201 Encounter for pregnancy test, result positive: Secondary | ICD-10-CM | POA: Diagnosis not present

## 2018-09-05 ENCOUNTER — Other Ambulatory Visit (HOSPITAL_COMMUNITY)
Admission: RE | Admit: 2018-09-05 | Discharge: 2018-09-05 | Disposition: A | Discharge status: Home / Self Care | Payer: BLUE CROSS/BLUE SHIELD | Source: Ambulatory Visit | Attending: Obstetrics and Gynecology | Admitting: Obstetrics and Gynecology

## 2018-09-05 DIAGNOSIS — O09211 Supervision of pregnancy with history of pre-term labor, first trimester: Secondary | ICD-10-CM | POA: Diagnosis not present

## 2018-09-05 DIAGNOSIS — Z3400 Encounter for supervision of normal first pregnancy, unspecified trimester: Secondary | ICD-10-CM | POA: Insufficient documentation

## 2018-09-06 ENCOUNTER — Other Ambulatory Visit: Payer: Self-pay | Admitting: Obstetrics and Gynecology

## 2018-09-11 LAB — CYTOLOGY - PAP
Chlamydia: NEGATIVE
DIAGNOSIS: NEGATIVE
HPV: NOT DETECTED
Neisseria Gonorrhea: NEGATIVE

## 2018-09-14 ENCOUNTER — Inpatient Hospital Stay (HOSPITAL_COMMUNITY)
Admission: AD | Admit: 2018-09-14 | Discharge: 2018-09-14 | Disposition: A | Discharge status: Home / Self Care | Payer: BLUE CROSS/BLUE SHIELD | Source: Ambulatory Visit | Attending: Obstetrics & Gynecology | Admitting: Obstetrics & Gynecology

## 2018-09-14 ENCOUNTER — Encounter (HOSPITAL_COMMUNITY): Payer: Self-pay | Admitting: *Deleted

## 2018-09-14 DIAGNOSIS — Z3A12 12 weeks gestation of pregnancy: Secondary | ICD-10-CM | POA: Diagnosis not present

## 2018-09-14 DIAGNOSIS — E039 Hypothyroidism, unspecified: Secondary | ICD-10-CM | POA: Insufficient documentation

## 2018-09-14 DIAGNOSIS — O209 Hemorrhage in early pregnancy, unspecified: Secondary | ICD-10-CM | POA: Diagnosis not present

## 2018-09-14 DIAGNOSIS — O99281 Endocrine, nutritional and metabolic diseases complicating pregnancy, first trimester: Secondary | ICD-10-CM | POA: Insufficient documentation

## 2018-09-14 LAB — URINALYSIS, ROUTINE W REFLEX MICROSCOPIC

## 2018-09-14 LAB — URINALYSIS, MICROSCOPIC (REFLEX): RBC / HPF: >50 RBC/hpf (ref 0–5)

## 2018-09-14 NOTE — Discharge Instructions (Signed)
First Trimester of Pregnancy The first trimester of pregnancy is from week 1 until the end of week 13 (months 1 through 3). A week after a sperm fertilizes an egg, the egg will implant on the wall of the uterus. This embryo will begin to develop into a baby. Genes from you and your partner will form the baby. The female genes will determine whether the baby will be a boy or a girl. At 6-8 weeks, the eyes and face will be formed, and the heartbeat can be seen on ultrasound. At the end of 12 weeks, all the baby's organs will be formed. Now that you are pregnant, you will want to do everything you can to have a healthy baby. Two of the most important things are to get good prenatal care and to follow your health care provider's instructions. Prenatal care is all the medical care you receive before the baby's birth. This care will help prevent, find, and treat any problems during the pregnancy and childbirth. Body changes during your first trimester Your body goes through many changes during pregnancy. The changes vary from woman to woman.  You may gain or lose a couple of pounds at first.  You may feel sick to your stomach (nauseous) and you may throw up (vomit). If the vomiting is uncontrollable, call your health care provider.  You may tire easily.  You may develop headaches that can be relieved by medicines. All medicines should be approved by your health care provider.  You may urinate more often. Painful urination may mean you have a bladder infection.  You may develop heartburn as a result of your pregnancy.  You may develop constipation because certain hormones are causing the muscles that push stool through your intestines to slow down.  You may develop hemorrhoids or swollen veins (varicose veins).  Your breasts may begin to grow larger and become tender. Your nipples may stick out more, and the tissue that surrounds them (areola) may become darker.  Your gums may bleed and may be  sensitive to brushing and flossing.  Dark spots or blotches (chloasma, mask of pregnancy) may develop on your face. This will likely fade after the baby is born.  Your menstrual periods will stop.  You may have a loss of appetite.  You may develop cravings for certain kinds of food.  You may have changes in your emotions from day to day, such as being excited to be pregnant or being concerned that something may go wrong with the pregnancy and baby.  You may have more vivid and strange dreams.  You may have changes in your hair. These can include thickening of your hair, rapid growth, and changes in texture. Some women also have hair loss during or after pregnancy, or hair that feels dry or thin. Your hair will most likely return to normal after your baby is born.  What to expect at prenatal visits During a routine prenatal visit:  You will be weighed to make sure you and the baby are growing normally.  Your blood pressure will be taken.  Your abdomen will be measured to track your baby's growth.  The fetal heartbeat will be listened to between weeks 10 and 14 of your pregnancy.  Test results from any previous visits will be discussed.  Your health care provider may ask you:  How you are feeling.  If you are feeling the baby move.  If you have had any abnormal symptoms, such as leaking fluid, bleeding, severe headaches,   or abdominal cramping.  If you are using any tobacco products, including cigarettes, chewing tobacco, and electronic cigarettes.  If you have any questions.  Other tests that may be performed during your first trimester include:  Blood tests to find your blood type and to check for the presence of any previous infections. The tests will also be used to check for low iron levels (anemia) and protein on red blood cells (Rh antibodies). Depending on your risk factors, or if you previously had diabetes during pregnancy, you may have tests to check for high blood  sugar that affects pregnant women (gestational diabetes).  Urine tests to check for infections, diabetes, or protein in the urine.  An ultrasound to confirm the proper growth and development of the baby.  Fetal screens for spinal cord problems (spina bifida) and Down syndrome.  HIV (human immunodeficiency virus) testing. Routine prenatal testing includes screening for HIV, unless you choose not to have this test.  You may need other tests to make sure you and the baby are doing well.  Follow these instructions at home: Medicines  Follow your health care provider's instructions regarding medicine use. Specific medicines may be either safe or unsafe to take during pregnancy.  Take a prenatal vitamin that contains at least 600 micrograms (mcg) of folic acid.  If you develop constipation, try taking a stool softener if your health care provider approves. Eating and drinking  Eat a balanced diet that includes fresh fruits and vegetables, whole grains, good sources of protein such as meat, eggs, or tofu, and low-fat dairy. Your health care provider will help you determine the amount of weight gain that is right for you.  Avoid raw meat and uncooked cheese. These carry germs that can cause birth defects in the baby.  Eating four or five small meals rather than three large meals a day may help relieve nausea and vomiting. If you start to feel nauseous, eating a few soda crackers can be helpful. Drinking liquids between meals, instead of during meals, also seems to help ease nausea and vomiting.  Limit foods that are high in fat and processed sugars, such as fried and sweet foods.  To prevent constipation: ? Eat foods that are high in fiber, such as fresh fruits and vegetables, whole grains, and beans. ? Drink enough fluid to keep your urine clear or pale yellow. Activity  Exercise only as directed by your health care provider. Most women can continue their usual exercise routine during  pregnancy. Try to exercise for 30 minutes at least 5 days a week. Exercising will help you: ? Control your weight. ? Stay in shape. ? Be prepared for labor and delivery.  Experiencing pain or cramping in the lower abdomen or lower back is a good sign that you should stop exercising. Check with your health care provider before continuing with normal exercises.  Try to avoid standing for long periods of time. Move your legs often if you must stand in one place for a long time.  Avoid heavy lifting.  Wear low-heeled shoes and practice good posture.  You may continue to have sex unless your health care provider tells you not to. Relieving pain and discomfort  Wear a good support bra to relieve breast tenderness.  Take warm sitz baths to soothe any pain or discomfort caused by hemorrhoids. Use hemorrhoid cream if your health care provider approves.  Rest with your legs elevated if you have leg cramps or low back pain.  If you develop   varicose veins in your legs, wear support hose. Elevate your feet for 15 minutes, 3-4 times a day. Limit salt in your diet. Prenatal care  Schedule your prenatal visits by the twelfth week of pregnancy. They are usually scheduled monthly at first, then more often in the last 2 months before delivery.  Write down your questions. Take them to your prenatal visits.  Keep all your prenatal visits as told by your health care provider. This is important. Safety  Wear your seat belt at all times when driving.  Make a list of emergency phone numbers, including numbers for family, friends, the hospital, and police and fire departments. General instructions  Ask your health care provider for a referral to a local prenatal education class. Begin classes no later than the beginning of month 6 of your pregnancy.  Ask for help if you have counseling or nutritional needs during pregnancy. Your health care provider can offer advice or refer you to specialists for help  with various needs.  Do not use hot tubs, steam rooms, or saunas.  Do not douche or use tampons or scented sanitary pads.  Do not cross your legs for long periods of time.  Avoid cat litter boxes and soil used by cats. These carry germs that can cause birth defects in the baby and possibly loss of the fetus by miscarriage or stillbirth.  Avoid all smoking, herbs, alcohol, and medicines not prescribed by your health care provider. Chemicals in these products affect the formation and growth of the baby.  Do not use any products that contain nicotine or tobacco, such as cigarettes and e-cigarettes. If you need help quitting, ask your health care provider. You may receive counseling support and other resources to help you quit.  Schedule a dentist appointment. At home, brush your teeth with a soft toothbrush and be gentle when you floss. Contact a health care provider if:  You have dizziness.  You have mild pelvic cramps, pelvic pressure, or nagging pain in the abdominal area.  You have persistent nausea, vomiting, or diarrhea.  You have a bad smelling vaginal discharge.  You have pain when you urinate.  You notice increased swelling in your face, hands, legs, or ankles.  You are exposed to fifth disease or chickenpox.  You are exposed to German measles (rubella) and have never had it. Get help right away if:  You have a fever.  You are leaking fluid from your vagina.  You have spotting or bleeding from your vagina.  You have severe abdominal cramping or pain.  You have rapid weight gain or loss.  You vomit blood or material that looks like coffee grounds.  You develop a severe headache.  You have shortness of breath.  You have any kind of trauma, such as from a fall or a car accident. Summary  The first trimester of pregnancy is from week 1 until the end of week 13 (months 1 through 3).  Your body goes through many changes during pregnancy. The changes vary from  woman to woman.  You will have routine prenatal visits. During those visits, your health care provider will examine you, discuss any test results you may have, and talk with you about how you are feeling. This information is not intended to replace advice given to you by your health care provider. Make sure you discuss any questions you have with your health care provider. Document Released: 11/09/2001 Document Revised: 10/27/2016 Document Reviewed: 10/27/2016 Elsevier Interactive Patient Education  2018 Elsevier   Inc.  

## 2018-09-14 NOTE — MAU Note (Signed)
Dark red vaginal bleeding that started around 1915.  Started with a small clot and put on a pad-has continued.  Had some spotting earlier in the pregnancy but that stopped around 5 weeks.  No other discharge.  No abdominal pain.

## 2018-09-14 NOTE — MAU Provider Note (Signed)
History     CSN: [id number]  Arrival date and time: 09/14/18 2011   First Provider Initiated Contact with Patient 09/14/18 2146      Chief Complaint  Patient presents with  . Vaginal Bleeding   [name] [name] is a 37 y.o. G2P0101 at [redacted]w[redacted]d who presents today with vaginal bleeding. She has had an [place] here with viable IUP. She has been seen at [facility] for care. She denies any complications at this time. Her next OB visit is 10/02/18.   Vaginal Bleeding  The patient's primary symptoms include vaginal bleeding. This is a new problem. The current episode started today (about 2 hours PTA ). The problem occurs intermittently. The problem has been resolved. The patient is experiencing no pain. She is pregnant. The vaginal discharge was bloody. The vaginal bleeding is lighter than menses. Passing clots: passed one clot about the size of her fingertip.  Nothing aggravates the symptoms. She has tried nothing for the symptoms. Sexual activity: Patient denies intercourse in the last 24 hours.     OB History    Gravida  2   Para  1   Term  0   Preterm  1   AB  0   Living  1     SAB  0   TAB  0   Ectopic  0   Multiple      Live Births  1           Past Medical History:  Diagnosis Date  . Hypothyroid   . Medical history non-contributory   . Thyroid disease     Past Surgical History:  Procedure Laterality Date  . APPENDECTOMY      Family History  Problem Relation Age of Onset  . Hyperlipidemia Mother   . Hypertension Mother   . Hyperlipidemia Father   . Hypertension Father     Social History   Tobacco Use  . Smoking status: Never Smoker  . Smokeless tobacco: Never Used  Substance Use Topics  . Alcohol use: Not Currently    Comment: occasional drinker  . Drug use: Never    Allergies: No Known Allergies  Medications Prior to Admission  Medication Sig Dispense Refill Last Dose  . ibuprofen (ADVIL,MOTRIN) 600 MG tablet Take 1 tablet (600 mg total) by  mouth every 6 (six) hours. 30 tablet 0   . levothyroxine (SYNTHROID, LEVOTHROID) 50 MCG tablet    08/01/2018 at Unknown time  . levothyroxine (SYNTHROID, LEVOTHROID) 88 MCG tablet Take 88 mcg by mouth daily before breakfast.     . Prenat w/o A-FE-Methfol-FA-DHA (PNV-DHA) 27-0.6-0.4-300 MG CAPS Take 1 tablet by mouth at bedtime. 30 capsule 3   . Prenatal Vit-Fe Fumarate-FA (PRENATAL MULTIVITAMIN) TABS Take 1 tablet by mouth daily.   06/15/2013 at Unknown    Review of Systems  Genitourinary: Positive for vaginal bleeding.   Physical Exam   Blood pressure 136/64, pulse 93, temperature 98.1 F (36.7 C), resp. rate 19, height 5\' 1"  (1.549 m), weight 62.7 kg, last menstrual period 05/30/2018, SpO2 99 %, unknown if currently breastfeeding.  Physical Exam  Nursing note and vitals reviewed. Constitutional: She is oriented to person, place, and time. She appears well-developed and well-nourished. No distress.  HENT:  Head: Normocephalic.  Cardiovascular: Normal rate.  Respiratory: Effort normal.  GI: Soft. There is no tenderness. There is no rebound.  Neurological: She is alert and oriented to person, place, and time.  Skin: Skin is warm and dry.  Psychiatric: She has  a normal mood and affect.    Pt informed that the ultrasound is considered a limited OB ultrasound and is not intended to be a complete ultrasound exam.  Patient also informed that the ultrasound is not being completed with the intent of assessing for fetal or placental anomalies or any pelvic abnormalities.  Explained that the purpose of today's ultrasound is to assess for  viability.  Patient acknowledges the purpose of the exam and the limitations of the study.    +FCA CRL 11 weeks 6 days c/w previous dating  MAU Course  Procedures  MDM Patient reassured with FHT and bedside US. Discussed bleeding precautions and when to return with the patient at length. Questions answered. Patient to FU with OB as planned or call the office  or return here with worsening sx.   Assessment and Plan   1. Vaginal bleeding in pregnancy, first trimester   2. [redacted] weeks gestation of pregnancy    DC home Comfort measures reviewed  1st/2nd Trimester precautions  Bleeding precautions RX: none  Return to MAU as needed FU with OB as planned  Follow-up Information    Gynecology, [facility].   Specialty:  Obstetrics and Gynecology Contact information: [address] [place] [address] [phone]            Thressa Sheller 09/14/2018, 9:57 PM

## 2018-09-20 DIAGNOSIS — Z3481 Encounter for supervision of other normal pregnancy, first trimester: Secondary | ICD-10-CM | POA: Diagnosis not present

## 2018-10-02 DIAGNOSIS — E039 Hypothyroidism, unspecified: Secondary | ICD-10-CM | POA: Diagnosis not present

## 2018-10-02 DIAGNOSIS — O4692 Antepartum hemorrhage, unspecified, second trimester: Secondary | ICD-10-CM | POA: Diagnosis not present

## 2018-10-02 DIAGNOSIS — O09522 Supervision of elderly multigravida, second trimester: Secondary | ICD-10-CM | POA: Diagnosis not present

## 2018-10-16 DIAGNOSIS — O468X9 Other antepartum hemorrhage, unspecified trimester: Secondary | ICD-10-CM | POA: Diagnosis not present

## 2018-10-16 DIAGNOSIS — O09522 Supervision of elderly multigravida, second trimester: Secondary | ICD-10-CM | POA: Diagnosis not present

## 2018-10-31 DIAGNOSIS — Z36 Encounter for antenatal screening for chromosomal anomalies: Secondary | ICD-10-CM | POA: Diagnosis not present

## 2018-10-31 DIAGNOSIS — O09522 Supervision of elderly multigravida, second trimester: Secondary | ICD-10-CM | POA: Diagnosis not present

## 2018-10-31 DIAGNOSIS — Z3482 Encounter for supervision of other normal pregnancy, second trimester: Secondary | ICD-10-CM | POA: Diagnosis not present

## 2018-11-08 DIAGNOSIS — Z8751 Personal history of pre-term labor: Secondary | ICD-10-CM | POA: Diagnosis not present

## 2018-11-15 DIAGNOSIS — Z8751 Personal history of pre-term labor: Secondary | ICD-10-CM | POA: Diagnosis not present

## 2018-11-23 DIAGNOSIS — Z8751 Personal history of pre-term labor: Secondary | ICD-10-CM | POA: Diagnosis not present

## 2018-11-29 NOTE — L&D Delivery Note (Signed)
Delivery Note At 3:19 PM a viable female was delivered via Vaginal, Spontaneous (Presentation:occiput left anterior ;  ).  Nuchal cord x 1 reduced APGAR: 9, 9; weight pending   .   Placenta status:complete with 3 vessek   Cord:  with the following complications:None .  Cord pH: NA  Anesthesia:Epidural    Episiotomy:  None Lacerations: 1st degree;Perineal Suture Repair: 3.0 vicryl Est. Blood Loss (mL):  150 mL  Mom to postpartum.  Baby to Couplet care / Skin to Skin.  [name] [name] 03/21/2019, 3:43 PM

## 2018-12-01 DIAGNOSIS — O09212 Supervision of pregnancy with history of pre-term labor, second trimester: Secondary | ICD-10-CM | POA: Diagnosis not present

## 2018-12-01 DIAGNOSIS — O09522 Supervision of elderly multigravida, second trimester: Secondary | ICD-10-CM | POA: Diagnosis not present

## 2018-12-01 DIAGNOSIS — O418X20 Other specified disorders of amniotic fluid and membranes, second trimester, not applicable or unspecified: Secondary | ICD-10-CM | POA: Diagnosis not present

## 2018-12-08 DIAGNOSIS — O418X20 Other specified disorders of amniotic fluid and membranes, second trimester, not applicable or unspecified: Secondary | ICD-10-CM | POA: Diagnosis not present

## 2018-12-15 DIAGNOSIS — Z8751 Personal history of pre-term labor: Secondary | ICD-10-CM | POA: Diagnosis not present

## 2018-12-22 DIAGNOSIS — Z8751 Personal history of pre-term labor: Secondary | ICD-10-CM | POA: Diagnosis not present

## 2018-12-29 DIAGNOSIS — O09522 Supervision of elderly multigravida, second trimester: Secondary | ICD-10-CM | POA: Diagnosis not present

## 2018-12-29 DIAGNOSIS — O09212 Supervision of pregnancy with history of pre-term labor, second trimester: Secondary | ICD-10-CM | POA: Diagnosis not present

## 2018-12-29 DIAGNOSIS — O26842 Uterine size-date discrepancy, second trimester: Secondary | ICD-10-CM | POA: Diagnosis not present

## 2019-01-05 DIAGNOSIS — O09522 Supervision of elderly multigravida, second trimester: Secondary | ICD-10-CM | POA: Diagnosis not present

## 2019-01-05 DIAGNOSIS — O09523 Supervision of elderly multigravida, third trimester: Secondary | ICD-10-CM | POA: Diagnosis not present

## 2019-01-12 DIAGNOSIS — O09212 Supervision of pregnancy with history of pre-term labor, second trimester: Secondary | ICD-10-CM | POA: Diagnosis not present

## 2019-01-13 DIAGNOSIS — J069 Acute upper respiratory infection, unspecified: Secondary | ICD-10-CM | POA: Diagnosis not present

## 2019-01-18 DIAGNOSIS — Z8751 Personal history of pre-term labor: Secondary | ICD-10-CM | POA: Diagnosis not present

## 2019-01-26 DIAGNOSIS — Z8751 Personal history of pre-term labor: Secondary | ICD-10-CM | POA: Diagnosis not present

## 2019-02-01 DIAGNOSIS — Z23 Encounter for immunization: Secondary | ICD-10-CM | POA: Diagnosis not present

## 2019-02-01 DIAGNOSIS — E039 Hypothyroidism, unspecified: Secondary | ICD-10-CM | POA: Diagnosis not present

## 2019-02-01 DIAGNOSIS — O09213 Supervision of pregnancy with history of pre-term labor, third trimester: Secondary | ICD-10-CM | POA: Diagnosis not present

## 2019-02-08 DIAGNOSIS — O09213 Supervision of pregnancy with history of pre-term labor, third trimester: Secondary | ICD-10-CM | POA: Diagnosis not present

## 2019-02-14 DIAGNOSIS — O09213 Supervision of pregnancy with history of pre-term labor, third trimester: Secondary | ICD-10-CM | POA: Diagnosis not present

## 2019-02-22 DIAGNOSIS — Z8751 Personal history of pre-term labor: Secondary | ICD-10-CM | POA: Diagnosis not present

## 2019-03-01 DIAGNOSIS — Z3490 Encounter for supervision of normal pregnancy, unspecified, unspecified trimester: Secondary | ICD-10-CM | POA: Diagnosis not present

## 2019-03-01 DIAGNOSIS — Z3483 Encounter for supervision of other normal pregnancy, third trimester: Secondary | ICD-10-CM | POA: Diagnosis not present

## 2019-03-01 LAB — OB RESULTS CONSOLE GBS: GBS: NEGATIVE

## 2019-03-21 ENCOUNTER — Other Ambulatory Visit: Payer: Self-pay

## 2019-03-21 ENCOUNTER — Encounter (HOSPITAL_COMMUNITY): Payer: Self-pay | Admitting: *Deleted

## 2019-03-21 ENCOUNTER — Inpatient Hospital Stay (HOSPITAL_COMMUNITY): Payer: BLUE CROSS/BLUE SHIELD | Admitting: Anesthesiology

## 2019-03-21 ENCOUNTER — Inpatient Hospital Stay (HOSPITAL_COMMUNITY)
Admission: AD | Admit: 2019-03-21 | Discharge: 2019-03-22 | DRG: 807 | Disposition: A | Discharge status: Home / Self Care | Payer: BLUE CROSS/BLUE SHIELD | Attending: Obstetrics and Gynecology | Admitting: Obstetrics and Gynecology

## 2019-03-21 DIAGNOSIS — O99284 Endocrine, nutritional and metabolic diseases complicating childbirth: Secondary | ICD-10-CM | POA: Diagnosis present

## 2019-03-21 DIAGNOSIS — E039 Hypothyroidism, unspecified: Secondary | ICD-10-CM | POA: Diagnosis not present

## 2019-03-21 DIAGNOSIS — O26893 Other specified pregnancy related conditions, third trimester: Secondary | ICD-10-CM | POA: Diagnosis not present

## 2019-03-21 DIAGNOSIS — N898 Other specified noninflammatory disorders of vagina: Secondary | ICD-10-CM | POA: Diagnosis not present

## 2019-03-21 DIAGNOSIS — Z0371 Encounter for suspected problem with amniotic cavity and membrane ruled out: Secondary | ICD-10-CM

## 2019-03-21 DIAGNOSIS — Z3A38 38 weeks gestation of pregnancy: Secondary | ICD-10-CM | POA: Diagnosis not present

## 2019-03-21 LAB — CBC
HCT: 39.4 % (ref 36.0–46.0)
Hemoglobin: 12.3 g/dL (ref 12.0–15.0)
MCH: 25.3 pg — ABNORMAL LOW (ref 26.0–34.0)
MCHC: 31.2 g/dL (ref 30.0–36.0)
MCV: 81.1 fL (ref 80.0–100.0)
Platelets: 247 10*3/uL (ref 150–400)
RBC: 4.86 MIL/uL (ref 3.87–5.11)
RDW: 16.8 % — ABNORMAL HIGH (ref 11.5–15.5)
WBC: 9.6 10*3/uL (ref 4.0–10.5)
nRBC: 0 % (ref 0.0–0.2)

## 2019-03-21 LAB — TYPE AND SCREEN
ABO/RH(D): B POS
Antibody Screen: NEGATIVE

## 2019-03-21 MED ORDER — LIDOCAINE HCL (PF) 1 % IJ SOLN
INTRAMUSCULAR | Status: DC | PRN
  Administered 2019-03-21: 11 mL via EPIDURAL

## 2019-03-21 MED ORDER — EPHEDRINE 5 MG/ML INJ: 10.0000 mg | INTRAVENOUS | Status: DC | PRN

## 2019-03-21 MED ORDER — LACTATED RINGERS IV SOLN
500.0000 mL | Freq: Once | INTRAVENOUS | Status: AC
  Administered 2019-03-21: 500 mL via INTRAVENOUS

## 2019-03-21 MED ORDER — IBUPROFEN 600 MG PO TABS
600.0000 mg | ORAL_TABLET | Freq: Four times a day (QID) | ORAL | Status: DC
  Administered 2019-03-21 – 2019-03-22 (×4): 600 mg via ORAL
  Filled 2019-03-21 (×4): qty 1

## 2019-03-21 MED ORDER — OXYTOCIN BOLUS FROM INFUSION
500.0000 mL | Freq: Once | INTRAVENOUS | Status: AC
  Administered 2019-03-21: 500 mL via INTRAVENOUS

## 2019-03-21 MED ORDER — LACTATED RINGERS IV SOLN
INTRAVENOUS | Status: DC
  Administered 2019-03-21 (×2): via INTRAVENOUS

## 2019-03-21 MED ORDER — LEVOTHYROXINE SODIUM 75 MCG PO TABS
75.0000 ug | ORAL_TABLET | Freq: Every day | ORAL | Status: DC
  Administered 2019-03-22: 75 ug via ORAL
  Filled 2019-03-21: qty 1

## 2019-03-21 MED ORDER — SOD CITRATE-CITRIC ACID 500-334 MG/5ML PO SOLN: 30.0000 mL | ORAL | Status: DC | PRN

## 2019-03-21 MED ORDER — OXYTOCIN 40 UNITS IN NORMAL SALINE INFUSION - SIMPLE MED
2.5000 [IU]/h | INTRAVENOUS | Status: DC
  Filled 2019-03-21: qty 1000

## 2019-03-21 MED ORDER — BENZOCAINE-MENTHOL 20-0.5 % EX AERO
1.0000 "application " | INHALATION_SPRAY | CUTANEOUS | Status: DC | PRN
  Administered 2019-03-21: 1 via TOPICAL
  Filled 2019-03-21 (×2): qty 56

## 2019-03-21 MED ORDER — FENTANYL-BUPIVACAINE-NACL 0.5-0.125-0.9 MG/250ML-% EP SOLN
12.0000 mL/h | EPIDURAL | Status: DC | PRN
  Filled 2019-03-21: qty 250

## 2019-03-21 MED ORDER — ACETAMINOPHEN 325 MG PO TABS: 650.0000 mg | ORAL_TABLET | ORAL | Status: DC | PRN

## 2019-03-21 MED ORDER — DIPHENHYDRAMINE HCL 50 MG/ML IJ SOLN: 12.5000 mg | INTRAMUSCULAR | Status: DC | PRN

## 2019-03-21 MED ORDER — SENNOSIDES-DOCUSATE SODIUM 8.6-50 MG PO TABS
2.0000 | ORAL_TABLET | ORAL | Status: DC
  Administered 2019-03-21: 2 via ORAL
  Filled 2019-03-21: qty 2

## 2019-03-21 MED ORDER — PHENYLEPHRINE 40 MCG/ML (10ML) SYRINGE FOR IV PUSH (FOR BLOOD PRESSURE SUPPORT): 80.0000 ug | PREFILLED_SYRINGE | INTRAVENOUS | Status: DC | PRN

## 2019-03-21 MED ORDER — PRENATAL MULTIVITAMIN CH
1.0000 | ORAL_TABLET | Freq: Every day | ORAL | Status: DC
  Administered 2019-03-22: 1 via ORAL
  Filled 2019-03-21: qty 1

## 2019-03-21 MED ORDER — LACTATED RINGERS IV SOLN: 500.0000 mL | INTRAVENOUS | Status: DC | PRN

## 2019-03-21 MED ORDER — OXYCODONE-ACETAMINOPHEN 5-325 MG PO TABS: 2.0000 | ORAL_TABLET | ORAL | Status: DC | PRN

## 2019-03-21 MED ORDER — WITCH HAZEL-GLYCERIN EX PADS: 1.0000 "application " | MEDICATED_PAD | CUTANEOUS | Status: DC | PRN

## 2019-03-21 MED ORDER — FLEET ENEMA 7-19 GM/118ML RE ENEM: 1.0000 | ENEMA | RECTAL | Status: DC | PRN

## 2019-03-21 MED ORDER — TERBUTALINE SULFATE 1 MG/ML IJ SOLN: 0.2500 mg | Freq: Once | INTRAMUSCULAR | Status: DC | PRN

## 2019-03-21 MED ORDER — ZOLPIDEM TARTRATE 5 MG PO TABS: 5.0000 mg | ORAL_TABLET | Freq: Every evening | ORAL | Status: DC | PRN

## 2019-03-21 MED ORDER — OXYTOCIN 40 UNITS IN NORMAL SALINE INFUSION - SIMPLE MED
1.0000 m[IU]/min | INTRAVENOUS | Status: DC
  Administered 2019-03-21: 13:00:00 2 m[IU]/min via INTRAVENOUS

## 2019-03-21 MED ORDER — SODIUM CHLORIDE (PF) 0.9 % IJ SOLN
INTRAMUSCULAR | Status: DC | PRN
  Administered 2019-03-21: 14 mL/h via EPIDURAL

## 2019-03-21 MED ORDER — METHYLERGONOVINE MALEATE 0.2 MG/ML IJ SOLN: 0.2000 mg | INTRAMUSCULAR | Status: DC | PRN

## 2019-03-21 MED ORDER — ONDANSETRON HCL 4 MG/2ML IJ SOLN: 4.0000 mg | Freq: Four times a day (QID) | INTRAMUSCULAR | Status: DC | PRN

## 2019-03-21 MED ORDER — FENTANYL CITRATE (PF) 100 MCG/2ML IJ SOLN: 50.0000 ug | INTRAMUSCULAR | Status: DC | PRN

## 2019-03-21 MED ORDER — OXYCODONE-ACETAMINOPHEN 5-325 MG PO TABS: 1.0000 | ORAL_TABLET | ORAL | Status: DC | PRN

## 2019-03-21 MED ORDER — COCONUT OIL OIL: 1.0000 "application " | TOPICAL_OIL | Status: DC | PRN

## 2019-03-21 MED ORDER — LIDOCAINE HCL (PF) 1 % IJ SOLN: 30.0000 mL | INTRAMUSCULAR | Status: DC | PRN

## 2019-03-21 MED ORDER — ONDANSETRON HCL 4 MG PO TABS: 4.0000 mg | ORAL_TABLET | ORAL | Status: DC | PRN

## 2019-03-21 MED ORDER — ONDANSETRON HCL 4 MG/2ML IJ SOLN: 4.0000 mg | INTRAMUSCULAR | Status: DC | PRN

## 2019-03-21 MED ORDER — METHYLERGONOVINE MALEATE 0.2 MG PO TABS: 0.2000 mg | ORAL_TABLET | ORAL | Status: DC | PRN

## 2019-03-21 MED ORDER — DIBUCAINE (PERIANAL) 1 % EX OINT: 1.0000 "application " | TOPICAL_OINTMENT | CUTANEOUS | Status: DC | PRN

## 2019-03-21 MED ORDER — DIPHENHYDRAMINE HCL 25 MG PO CAPS: 25.0000 mg | ORAL_CAPSULE | Freq: Four times a day (QID) | ORAL | Status: DC | PRN

## 2019-03-21 MED ORDER — SIMETHICONE 80 MG PO CHEW: 80.0000 mg | CHEWABLE_TABLET | ORAL | Status: DC | PRN

## 2019-03-21 NOTE — H&P (Signed)
[name] [name] is a 38 y.o. female G2 P1001 at 38 wks and 6 days that presents in active labor. Pregnancy complicated by h/o PTL with previous pregnancy and hypothyroidism. Contractions every 5-6 minutes cervix was initially 1.5 cm however she progressed to 4 cm while being monitored in MAU. +FM  She felt like she ruptured at 4 am however fern was negative +Bloody show.    OB History    Gravida  2   Para  1   Term  0   Preterm  1   AB  0   Living  1     SAB  0   TAB  0   Ectopic  0   Multiple      Live Births  1          Past Medical History:  Diagnosis Date  . Hypothyroid   . Medical history non-contributory   . Thyroid disease    Past Surgical History:  Procedure Laterality Date  . APPENDECTOMY     Family History: family history includes Hyperlipidemia in her father and mother; Hypertension in her father and mother. Social History:  reports that she has never smoked. She has never used smokeless tobacco. She reports previous alcohol use. She reports that she does not use drugs. Meds, Levothyroxine and Prenatal vitamins  Allergies: NKDA    Maternal Diabetes: No Genetic Screening: Normal Maternal Ultrasounds/Referrals: Normal Fetal Ultrasounds or other Referrals:  None Maternal Substance Abuse:  No Significant Maternal Medications:  Meds include: Other: Levothyroxine  Significant Maternal Lab Results:  Lab values include: Group B Strep negative Other Comments:  None  Review of Systems  Constitutional: Negative.   HENT: Negative.   Eyes: Negative.   Respiratory: Negative.   Cardiovascular: Negative.   Gastrointestinal: Negative.   Genitourinary: Negative.   Musculoskeletal: Negative.   Skin: Negative.   Neurological: Negative.   Endo/Heme/Allergies: Negative.   Psychiatric/Behavioral: Negative.    History Dilation: 10 Effacement (%): 80 Station: Plus 2 Exam by:: Welford Roche, RNC Blood pressure 125/71, pulse (!) 113, temperature 98.1 F  (36.7 C), temperature source Oral, resp. rate 18, height 5\' 1"  (1.549 m), weight 75.3 kg, last menstrual period 05/30/2018, SpO2 100 %, unknown if currently breastfeeding. Exam Physical Exam  Vitals reviewed. Constitutional: She is oriented to person, place, and time. She appears well-developed and well-nourished.  HENT:  Head: Normocephalic and atraumatic.  Eyes: Pupils are equal, round, and reactive to light. Conjunctivae are normal.  Neck: Neck supple.  Cardiovascular: Normal rate and regular rhythm.  Respiratory: Effort normal and breath sounds normal.  GI: There is no abdominal tenderness.  Genitourinary:    Vagina normal.   Musculoskeletal:        General: No edema.  Neurological: She is alert and oriented to person, place, and time.  Skin: Skin is warm and dry.  Psychiatric: She has a normal mood and affect.   4/75/-2 AROM clear fluid   Prenatal labs: ABO, Rh: --/--/B POS, B POS Performed at [facility], [address], [place], [place] [address]  [phone]/22 0940) Antibody: NEG (04/22 0940) Rubella:  Immune   RPR:   Nonereactive  HBsAg:   Negative  HIV:   Negative  GBS: Negative (04/02 0000)   Assessment/Plan: 38 wks and 6 days in active labor   ARom performed. If no cervical change in 1 hour start pitocin  Pt desires epidural for pain control .  Anticipate SVD  Gerald Leitz 03/21/2019, 3:04 PM

## 2019-03-21 NOTE — Lactation Note (Signed)
This note was copied from a baby's chart. Lactation Consultation Note  Patient Name: [name] [name] Today's Date: 03/21/2019 Reason for consult: Initial assessment;Early term 37-38.6wks;1st time breastfeeding  P2 mother whose infant is now 78 hours old.  Mother was not able to breast feed her first child.  Baby was asleep in bassinet when I arrived.  Mother had a few breast feeding questions which I answered to her satisfaction.  Encouraged to feed 8-12 times/24 hours or sooner if baby shows cues.  Reviewed feeding cues.  Taught mother hand expression and she was able to express one drop of colostrum from right breast.  Colostrum container provided and milk storage times reviewed.  Finger feeding demonstrated.  Mother's breasts are soft and non tender and nipples are everted and intact.  Discussed and demonstrated a couple of breast feeding positions and reviewed many basic breast feeding concepts.  Parents are very receptive to learning and mother has a strong desire to breast feed this baby.    Mother will be returning to work in 12 weeks and already has a DEBP for home use.  Encouraged to call RN/LC for latch assistance.   Maternal Data Formula Feeding for Exclusion: No Has patient been taught Hand Expression?: Yes Does the patient have breastfeeding experience prior to this delivery?: No  Feeding Feeding Type: Breast Fed  LATCH Score Latch: Repeated attempts needed to sustain latch, nipple held in mouth throughout feeding, stimulation needed to elicit sucking reflex.  Audible Swallowing: A few with stimulation  Type of Nipple: Everted at rest and after stimulation  Comfort (Breast/Nipple): Soft / non-tender  Hold (Positioning): Assistance needed to correctly position infant at breast and maintain latch.  LATCH Score: 7  Interventions    Lactation Tools Discussed/Used WIC Program: No   Consult Status Consult Status: Follow-up Date: 03/22/19 Follow-up type:  In-patient    Dora Sims 03/21/2019, 7:16 PM

## 2019-03-21 NOTE — MAU Provider Note (Signed)
History   [id number]  Dr Richardson Doppole  Provider saw patient at [phone]  HPI [name] MallickSweta [name] is a 38 y.o. female  G2P0101 here with report of watery vaginal discharge that began at approximately 0400.  Leaking of fluid has not continued.  Pt reports some contractions and denies vaginal bleeding.   +fetal movement.   All other systems negative.    RN Note: PT SAYS SROM AT 0400-  UNDERWEAR WAS WET - HAS HAD D/C X2 DAYS . NO GUSH- FEELS WET WHEN SHE WIPES.  [facility] - DR COLE-   WAS SUPPOSE TO CHECK TODAY- WAS CLOSED LAST WEEK.   DENIES HSV AND MRSA. GBS- NEG .  FEELS SOME UC'S - PRESSURE  Patient's last menstrual period was 05/30/2018.  OB History  Gravida Para Term Preterm AB Living  2 1 0 1 0 1  SAB TAB Ectopic Multiple Live Births  0 0 0   1    # Outcome Date GA Lbr Len/2nd Weight Sex Delivery Anes PTL Lv  2 Current           1 Preterm 06/17/13 [id number] 02:37 / 01:32 2040 g M Vag-Spont EPI  LIV    Past Medical History:  Diagnosis Date  . Hypothyroid   . Medical history non-contributory   . Thyroid disease     Family History  Problem Relation Age of Onset  . Hyperlipidemia Mother   . Hypertension Mother   . Hyperlipidemia Father   . Hypertension Father     Social History   Socioeconomic History  . Marital status: Married    Spouse name: Not on file  . Number of children: Not on file  . Years of education: Not on file  . Highest education level: Not on file  Occupational History  . Not on file  Social Needs  . Financial resource strain: Not on file  . Food insecurity:    Worry: Not on file    Inability: Not on file  . Transportation needs:    Medical: Not on file    Non-medical: Not on file  Tobacco Use  . Smoking status: Never Smoker  . Smokeless tobacco: Never Used  Substance and Sexual Activity  . Alcohol use: Not Currently    Comment: occasional drinker  . Drug use: Never  . Sexual activity: Yes    Birth control/protection: None  Lifestyle  . Physical activity:   Days per week: Not on file    Minutes per session: Not on file  . Stress: Not on file  Relationships  . Social connections:    Talks on phone: Not on file    Gets together: Not on file    Attends religious service: Not on file    Active member of club or organization: Not on file    Attends meetings of clubs or organizations: Not on file    Relationship status: Not on file  Other Topics Concern  . Not on file  Social History Narrative   ** Merged History Encounter **        No Known Allergies  No current facility-administered medications on file prior to encounter.    Current Outpatient Medications on File Prior to Encounter  Medication Sig Dispense Refill  . levothyroxine (SYNTHROID) 75 MCG tablet Take 75 mcg by mouth daily before breakfast.    . Prenat w/o A-FE-Methfol-FA-DHA (PNV-DHA) 27-0.6-0.4-300 MG CAPS Take 1 tablet by mouth at bedtime. 30 capsule 3  . levothyroxine (SYNTHROID, LEVOTHROID) 50 MCG tablet     .  levothyroxine (SYNTHROID, LEVOTHROID) 88 MCG tablet Take 88 mcg by mouth daily before breakfast.    . Prenatal Vit-Fe Fumarate-FA (PRENATAL MULTIVITAMIN) TABS Take 1 tablet by mouth daily.      Review of Systems  Constitutional: Negative for chills and fever.  Gastrointestinal: Negative for constipation, diarrhea and nausea.  Genitourinary: Positive for pelvic pain and vaginal discharge. Negative for vaginal bleeding.    Physical Exam   Vitals:   03/21/19 0548 03/21/19 0604  BP: 125/83 123/83  Pulse:  99  Temp: 98.5 F (36.9 C)   TempSrc: Oral   Weight: 75.3 kg   Height: 5\' 1"  (1.549 m)     Physical Exam  Constitutional: She is oriented to person, place, and time. She appears well-developed and well-nourished. No distress.  HENT:  Head: Normocephalic.  Cardiovascular: Normal rate.  Respiratory: Effort normal. No respiratory distress.  GI: Soft. There is no abdominal tenderness.  Genitourinary:    Genitourinary Comments: Dilation: 1.5 Effacement  (%): 50 Station: -2 Presentation: Vertex Exam by:: Nazaria Riesen, CNM    Musculoskeletal: Normal range of motion.  Neurological: She is alert and oriented to person, place, and time.  Skin: Skin is warm and dry.  Psychiatric: She has a normal mood and affect.   Fetal heart rate reactive, average variability Irregular contractions  MAU Course  Procedures  MDM Negative pooling and negative ferning on speculum exam Is having mild but regular contractions, will recheck in an hour.   Assessment and Plan   SIUP at @[redacted]w[redacted]d @ Vaginal discharge, no evidence of ROM  RN will continue labor evaluation   [name] [name] 03/21/2019 6:47 AM

## 2019-03-21 NOTE — Anesthesia Preprocedure Evaluation (Signed)
Anesthesia Evaluation  Patient identified by MRN, date of birth, ID band Patient awake    Reviewed: Allergy & Precautions, H&P , Patient's Chart, lab work & pertinent test results  Airway Mallampati: III  TM Distance: >3 FB Neck ROM: Full    Dental no notable dental hx.    Pulmonary neg pulmonary ROS,    Pulmonary exam normal breath sounds clear to auscultation       Cardiovascular negative cardio ROS Normal cardiovascular exam Rhythm:Regular Rate:Normal     Neuro/Psych negative neurological ROS  negative psych ROS   GI/Hepatic negative GI ROS, Neg liver ROS,   Endo/Other  Hypothyroidism   Renal/GU negative Renal ROS  negative genitourinary   Musculoskeletal negative musculoskeletal ROS (+)   Abdominal   Peds  Hematology negative hematology ROS (+)   Anesthesia Other Findings   Reproductive/Obstetrics (+) Pregnancy 34 Weeks PTL                             Anesthesia Physical  Anesthesia Plan  ASA: II  Anesthesia Plan: Epidural   Post-op Pain Management:    Induction:   PONV Risk Score and Plan:   Airway Management Planned: Natural Airway  Additional Equipment:   Intra-op Plan:   Post-operative Plan:   Informed Consent: I have reviewed the patients History and Physical, chart, labs and discussed the procedure including the risks, benefits and alternatives for the proposed anesthesia with the patient or authorized representative who has indicated his/her understanding and acceptance.     Dental advisory given  Plan Discussed with: Anesthesiologist and Surgeon  Anesthesia Plan Comments:         Anesthesia Quick Evaluation

## 2019-03-21 NOTE — Anesthesia Procedure Notes (Signed)
Epidural Patient location during procedure: OB Start time: 03/21/2019 1:18 PM End time: 03/21/2019 1:37 PM  Staffing Anesthesiologist: Lowella Curb, MD Performed: anesthesiologist   Preanesthetic Checklist Completed: patient identified, site marked, surgical consent, pre-op evaluation, timeout performed, IV checked, risks and benefits discussed and monitors and equipment checked  Epidural Patient position: sitting Prep: ChloraPrep Patient monitoring: heart rate, cardiac monitor, continuous pulse ox and blood pressure Approach: midline Location: L2-L3 Injection technique: LOR saline  Needle:  Needle type: Tuohy  Needle gauge: 17 G Needle length: 9 cm Needle insertion depth: 5 cm Catheter type: closed end flexible Catheter size: 20 Guage Catheter at skin depth: 9 cm Test dose: negative  Assessment Events: blood not aspirated, injection not painful, no injection resistance, negative IV test and no paresthesia  Additional Notes Reason for block:procedure for pain

## 2019-03-21 NOTE — MAU Note (Signed)
PT SAYS SROM AT 0400-  UNDERWEAR WAS WET - HAS HAD D/C X2 DAYS . NO GUSH- FEELS WET WHEN SHE WIPES.  [facility] - DR COLE-   WAS SUPPOSE TO CHECK TODAY- WAS CLOSED LAST WEEK.   DENIES HSV AND MRSA. GBS- NEG .  FEELS SOME UC'S - PRESSURE

## 2019-03-22 DIAGNOSIS — Z3A38 38 weeks gestation of pregnancy: Secondary | ICD-10-CM

## 2019-03-22 DIAGNOSIS — O26893 Other specified pregnancy related conditions, third trimester: Secondary | ICD-10-CM

## 2019-03-22 DIAGNOSIS — N898 Other specified noninflammatory disorders of vagina: Secondary | ICD-10-CM

## 2019-03-22 LAB — CBC
HCT: 33.8 % — ABNORMAL LOW (ref 36.0–46.0)
Hemoglobin: 10.7 g/dL — ABNORMAL LOW (ref 12.0–15.0)
MCH: 25.6 pg — ABNORMAL LOW (ref 26.0–34.0)
MCHC: 31.7 g/dL (ref 30.0–36.0)
MCV: 80.9 fL (ref 80.0–100.0)
Platelets: 211 10*3/uL (ref 150–400)
RBC: 4.18 MIL/uL (ref 3.87–5.11)
RDW: 17 % — ABNORMAL HIGH (ref 11.5–15.5)
WBC: 10.7 10*3/uL — ABNORMAL HIGH (ref 4.0–10.5)
nRBC: 0 % (ref 0.0–0.2)

## 2019-03-22 LAB — ABO/RH: ABO/RH(D): B POS

## 2019-03-22 LAB — RPR: RPR Ser Ql: NONREACTIVE

## 2019-03-22 MED ORDER — IBUPROFEN 600 MG PO TABS: 600.0000 mg | ORAL_TABLET | Freq: Four times a day (QID) | ORAL | 0 refills | Status: AC | PRN

## 2019-03-22 NOTE — Discharge Instructions (Signed)
Vaginal Delivery, Care After °Refer to this sheet in the next few weeks. These instructions provide you with information about caring for yourself after vaginal delivery. Your health care provider may also give you more specific instructions. Your treatment has been planned according to current medical practices, but problems sometimes occur. Call your health care provider if you have any problems or questions. °What can I expect after the procedure? °After vaginal delivery, it is common to have: °· Some bleeding from your vagina. °· Soreness in your abdomen, your vagina, and the area of skin between your vaginal opening and your anus (perineum). °· Pelvic cramps. °· Fatigue. °Follow these instructions at home: °Medicines °· Take over-the-counter and prescription medicines only as told by your health care provider. °· If you were prescribed an antibiotic medicine, take it as told by your health care provider. Do not stop taking the antibiotic until it is finished. °Driving ° °· Do not drive or operate heavy machinery while taking prescription pain medicine. °· Do not drive for 24 hours if you received a sedative. °Lifestyle °· Do not drink alcohol. This is especially important if you are breastfeeding or taking medicine to relieve pain. °· Do not use tobacco products, including cigarettes, chewing tobacco, or e-cigarettes. If you need help quitting, ask your health care provider. °Eating and drinking °· Drink at least 8 eight-ounce glasses of water every day unless you are told not to by your health care provider. If you choose to breastfeed your baby, you may need to drink more water than this. °· Eat high-fiber foods every day. These foods may help prevent or relieve constipation. High-fiber foods include: °? Whole grain cereals and breads. °? Brown rice. °? Beans. °? Fresh fruits and vegetables. °Activity °· Return to your normal activities as told by your health care provider. Ask your health care provider what  activities are safe for you. °· Rest as much as possible. Try to rest or take a nap when your baby is sleeping. °· Do not lift anything that is heavier than your baby or 10 lb (4.5 kg) until your health care provider says that it is safe. °· Talk with your health care provider about when you can engage in sexual activity. This may depend on your: °? Risk of infection. °? Rate of healing. °? Comfort and desire to engage in sexual activity. °Vaginal Care °· If you have an episiotomy or a vaginal tear, check the area every day for signs of infection. Check for: °? More redness, swelling, or pain. °? More fluid or blood. °? Warmth. °? Pus or a bad smell. °· Do not use tampons or douches until your health care provider says this is safe. °· Watch for any blood clots that may pass from your vagina. These may look like clumps of dark red, brown, or black discharge. °General instructions °· Keep your perineum clean and dry as told by your health care provider. °· Wear loose, comfortable clothing. °· Wipe from front to back when you use the toilet. °· Ask your health care provider if you can shower or take a bath. If you had an episiotomy or a perineal tear during labor and delivery, your health care provider may tell you not to take baths for a certain length of time. °· Wear a bra that supports your breasts and fits you well. °· If possible, have someone help you with household activities and help care for your baby for at least a few days after you   leave the hospital. °· Keep all follow-up visits for you and your baby as told by your health care provider. This is important. °Contact a health care provider if: °· You have: °? Vaginal discharge that has a bad smell. °? Difficulty urinating. °? Pain when urinating. °? A sudden increase or decrease in the frequency of your bowel movements. °? More redness, swelling, or pain around your episiotomy or vaginal tear. °? More fluid or blood coming from your episiotomy or vaginal  tear. °? Pus or a bad smell coming from your episiotomy or vaginal tear. °? A fever. °? A rash. °? Little or no interest in activities you used to enjoy. °? Questions about caring for yourself or your baby. °· Your episiotomy or vaginal tear feels warm to the touch. °· Your episiotomy or vaginal tear is separating or does not appear to be healing. °· Your breasts are painful, hard, or turn red. °· You feel unusually sad or worried. °· You feel nauseous or you vomit. °· You pass large blood clots from your vagina. If you pass a blood clot from your vagina, save it to show to your health care provider. Do not flush blood clots down the toilet without having your health care provider look at them. °· You urinate more than usual. °· You are dizzy or light-headed. °· You have not breastfed at all and you have not had a menstrual period for 12 weeks after delivery. °· You have stopped breastfeeding and you have not had a menstrual period for 12 weeks after you stopped breastfeeding. °Get help right away if: °· You have: °? Pain that does not go away or does not get better with medicine. °? Chest pain. °? Difficulty breathing. °? Blurred vision or spots in your vision. °? Thoughts about hurting yourself or your baby. °· You develop pain in your abdomen or in one of your legs. °· You develop a severe headache. °· You faint. °· You bleed from your vagina so much that you fill two sanitary pads in one hour. °This information is not intended to replace advice given to you by your health care provider. Make sure you discuss any questions you have with your health care provider. °Document Released: 11/12/2000 Document Revised: 04/28/2016 Document Reviewed: 11/30/2015 °Elsevier Interactive Patient Education © 2019 [employer] ° °

## 2019-03-22 NOTE — Progress Notes (Signed)
Postpartum Note Day # 1  S:  Patient resting comfortable in bed.  Pain controlled.  Tolerating general diet. No flatus, no BM.  Lochia moderate.  Ambulating without difficulty.  She denies n/v/f/c, SOB, or CP.  Pt plans on breastfeeding.  O: Temp:  [98.1 F (36.7 C)-99.3 F (37.4 C)] 98.3 F (36.8 C) (04/23 0630) Pulse Rate:  [90-127] 90 (04/23 0630) Resp:  [18] 18 (04/23 0630) BP: (106-137)/(45-84) 111/66 (04/23 0630) SpO2:  [98 %-100 %] 99 % (04/23 0630)   Gen: A&Ox3, NAD CV: RRR, no MRG Resp: CTAB Abdomen: soft, NT, ND Uterus: firm, non-tender, below umbilicus Ext: No edema, no calf tenderness bilaterally  Labs:  Recent Labs    03/21/19 0908 03/22/19 0518  HGB 12.3 10.7*    A/P: Pt is a 38 y.o. [id number] s/p NSVD, PPD#1  - Pain well controlled -GU: voiding freely -GI: Tolerating general diet -Activity: encouraged sitting up to chair and ambulation as tolerated -Prophylaxis: early ambulation -Labs: appropriate decline following recent delivery  DISPO: Continue routine postpartum care, considering early discharge this afternoon pending PEDS discharge  Myna Hidalgo, DO [phone] (cell) [phone] (office)

## 2019-03-22 NOTE — Lactation Note (Addendum)
This note was copied from a baby's chart. Lactation Consultation Note  Patient Name: [name] [name] Today's Date: 03/22/2019   Mom reports that infant is "latching pretty good."This infant has already gone to the breast at least 7 times within the 1st 23 hours of life & has had multiple stools. Mom reports initial tenderness with latch. Her L nipple has a slight abrasion on the tip; her R nipple is atraumatic. Mom denies nipple shape distortion when infant releases latch. Mom does have some nipple soreness even when infant is not feeding; shells were provided to protect nipples from friction of gown, etc. Specifics of an asymmetric latch also shown via [employer].   Mom has ordered a Spectra 2 pump for home use. She also requested a hand pump, which she was shown how to use (for later home use).  Mom also had a question about lactogenic foods. Mom is being treated for hypothyroidism.  Note: Mom's 1st baby, born at 34 weeks, was in the NICU for about 3 weeks. Mom reports not having enough milk for her 1st baby, but also self-reports that she did not express her milk adequately during that time.  Lurline Hare [facility] 03/22/2019, 2:17 PM

## 2019-03-22 NOTE — Anesthesia Postprocedure Evaluation (Signed)
Anesthesia Post Note  Patient: [name] [name]  Procedure(s) Performed: AN AD HOC LABOR EPIDURAL     Patient location during evaluation: Mother Baby Anesthesia Type: Epidural Level of consciousness: awake, awake and alert and oriented Pain management: pain level controlled Vital Signs Assessment: post-procedure vital signs reviewed and stable Respiratory status: spontaneous breathing and respiratory function stable Cardiovascular status: blood pressure returned to baseline Postop Assessment: no headache, no backache, epidural receding, patient able to bend at knees, no apparent nausea or vomiting, adequate PO intake and able to ambulate Anesthetic complications: no    Last Vitals:  Vitals:   03/22/19 0200 03/22/19 0630  BP: 114/70 111/66  Pulse: 100 90  Resp: 18 18  Temp: 36.9 C 36.8 C  SpO2: 98% 99%    Last Pain:  Vitals:   03/22/19 0630  TempSrc: Oral  PainSc: 3    Pain Goal:                   Cleda Clarks

## 2019-03-23 NOTE — Discharge Summary (Signed)
OB Discharge Summary     Patient Name: [name] MallickSweta Bucy DOB: 1981/01/29 MRN: [id number]  Date of admission: 03/21/2019 Delivering MD: Gerald LeitzOLE, TARA   Date of discharge: 03/22/2019  Admitting diagnosis: 38.6WKS WATER BROKE Intrauterine pregnancy: 1271w6d     Secondary diagnosis:  Active Problems:   Normal labor  Additional problems: hypothyroidism     Discharge diagnosis: Term Pregnancy Delivered                                                                                                Post partum procedures:n/a  Augmentation: Pitocin  Complications: None  Hospital course:  Onset of Labor With Vaginal Delivery     38 y.o. yo [id number] at 6571w6d was admitted in Latent Labor on 03/21/2019. Patient had an uncomplicated labor course as follows:  Membrane Rupture Time/Date: 11:25 AM ,03/21/2019   Intrapartum Procedures: Episiotomy: None [1]                                         Lacerations:  1st degree [2];Perineal [11]  Patient had a delivery of a Viable infant. 03/21/2019  Information for the patient's newborn:  Cooper RenderGona, Akira Suzanne [[id number]][[id number]]  Delivery Method: Vaginal, Spontaneous(Filed from Delivery Summary)    Pateint had an uncomplicated postpartum course.  She is ambulating, tolerating a regular diet, passing flatus, and urinating well. Patient is discharged home in stable condition on 03/23/19.   Physical exam  Vitals:   03/21/19 1810 03/21/19 2200 03/22/19 0200 03/22/19 0630  BP: 126/63 114/64 114/70 111/66  Pulse: (!) 112 97 100 90  Resp: 18 18 18 18   Temp: 99.3 F (37.4 C) 98.6 F (37 C) 98.4 F (36.9 C) 98.3 F (36.8 C)  TempSrc: Oral Oral Oral Oral  SpO2: 100% 100% 98% 99%  Weight:      Height:       General: alert, cooperative and no distress Lochia: appropriate Uterine Fundus: firm Incision: N/A DVT Evaluation: No evidence of DVT seen on physical exam. Labs: Lab Results  Component Value Date   WBC 10.7 (H) 03/22/2019   HGB 10.7 (L) 03/22/2019   HCT  33.8 (L) 03/22/2019   MCV 80.9 03/22/2019   PLT 211 03/22/2019   CMP Latest Ref Rng & Units 09/24/2011  Glucose 70 - 99 mg/dL 90  BUN 6 - 23 mg/dL 7  Creatinine 1.300.50 - 8.651.10 mg/dL 7.840.60  Sodium 696135 - 295145 mEq/L 138  Potassium 3.5 - 5.1 mEq/L 4.4  Chloride 96 - 112 mEq/L 104  CO2 19 - 32 mEq/L 26  Calcium 8.4 - 10.5 mg/dL 9.5    Discharge instruction: per After Visit Summary and "Baby and Me Booklet".  After visit meds:  Allergies as of 03/22/2019   No Known Allergies     Medication List    TAKE these medications   ibuprofen 600 MG tablet Commonly known as:  ADVIL Take 1 tablet (600 mg total) by mouth every 6 (six) hours as needed for mild pain or moderate pain.  levothyroxine 75 MCG tablet Commonly known as:  SYNTHROID Take 75 mcg by mouth daily before breakfast.   PNV-DHA 27-0.6-0.4-300 MG Caps Take 1 tablet by mouth at bedtime.       Diet: routine diet  Activity: Advance as tolerated. Pelvic rest for 6 weeks.   Outpatient follow up:6 weeks Follow up Appt:No future appointments. Follow up Visit:No follow-ups on file.  Postpartum contraception: Undecided  Newborn Data: Live born female  Birth Weight: 6 lb 6.7 oz (2910 g) APGAR: 9, 9  Newborn Delivery   Birth date/time:  03/21/2019 15:19:00 Delivery type:  Vaginal, Spontaneous     Baby Feeding: Breast Disposition:home with mother   03/23/2019 Sharon Seller, DO

## 2019-05-10 DIAGNOSIS — E039 Hypothyroidism, unspecified: Secondary | ICD-10-CM | POA: Diagnosis not present

## 2019-12-17 DIAGNOSIS — M7662 Achilles tendinitis, left leg: Secondary | ICD-10-CM | POA: Diagnosis not present

## 2019-12-17 DIAGNOSIS — M7661 Achilles tendinitis, right leg: Secondary | ICD-10-CM | POA: Diagnosis not present

## 2020-01-10 DIAGNOSIS — E039 Hypothyroidism, unspecified: Secondary | ICD-10-CM | POA: Diagnosis not present

## 2020-02-01 IMAGING — US US OB < 14 WEEKS - US OB TV
1 series · 15 of 28 positions shown · non-contrast
Comparison: None.

CLINICAL DATA: Vaginal bleeding

EXAM:
OBSTETRIC <14 WK US AND TRANSVAGINAL OB US
TECHNIQUE: Both transabdominal and transvaginal ultrasound examinations were
performed for complete evaluation of the gestation as well as the
maternal uterus, adnexal regions, and pelvic cul-de-sac.
Transvaginal technique was performed to assess early pregnancy.

[Series 1: us ob < 14 weeks - us ob tv · 15 of 39 slices shown]
[im 1/39]
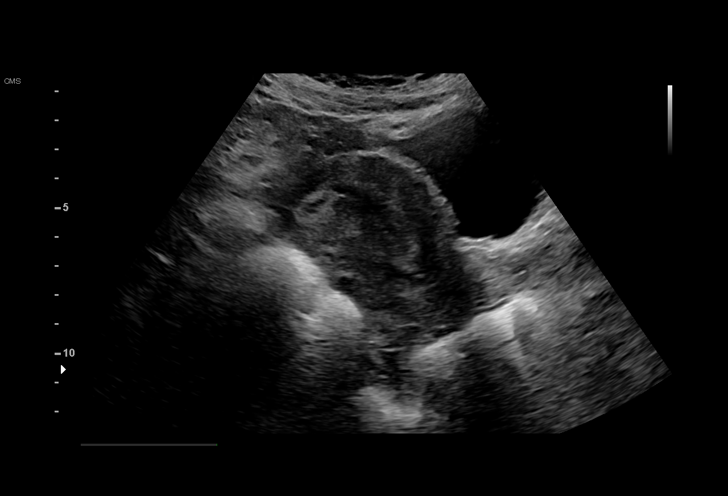
[im 3/39]
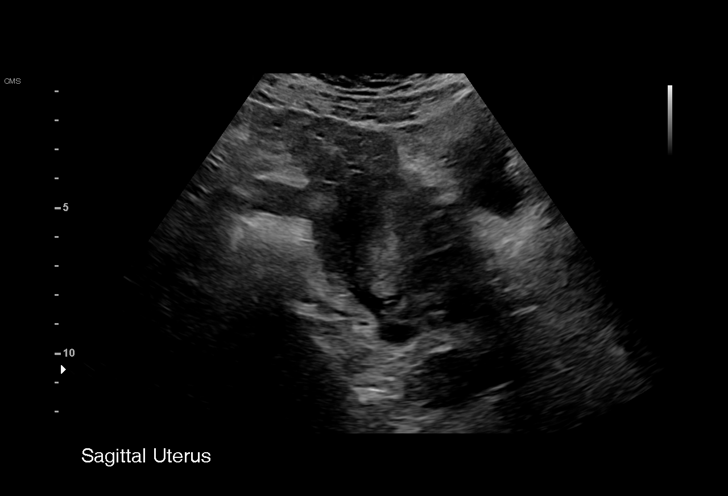
[im 6/39]
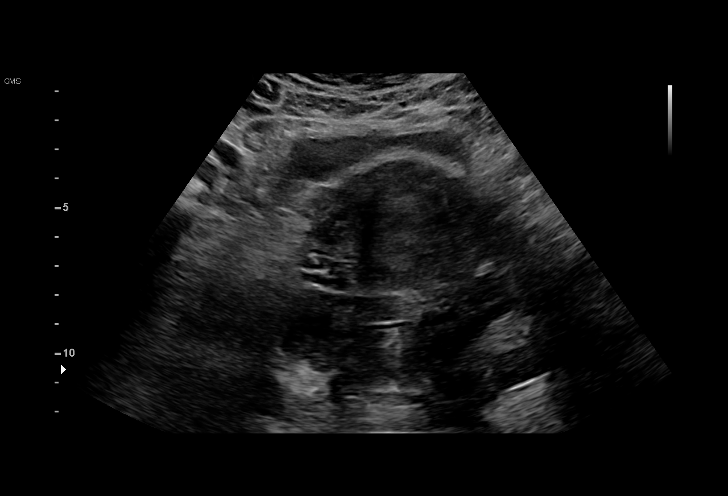
[im 9/39]
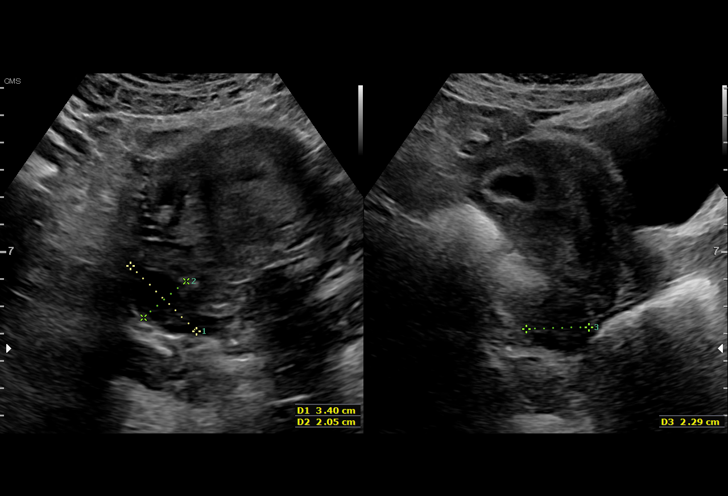
[im 12/39]
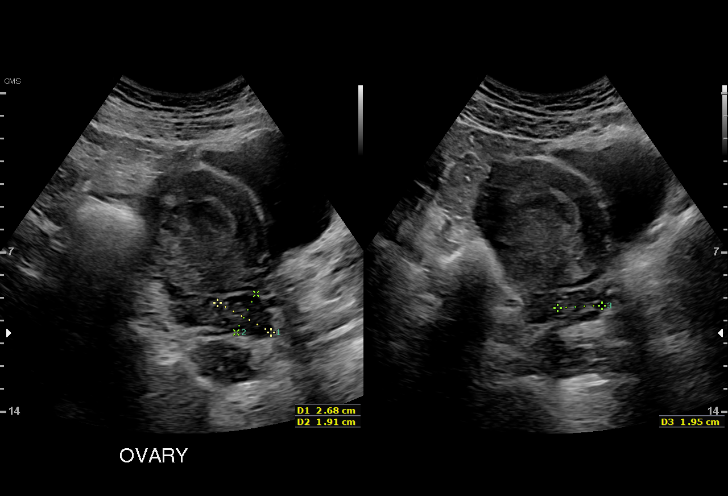
[im 15/39]
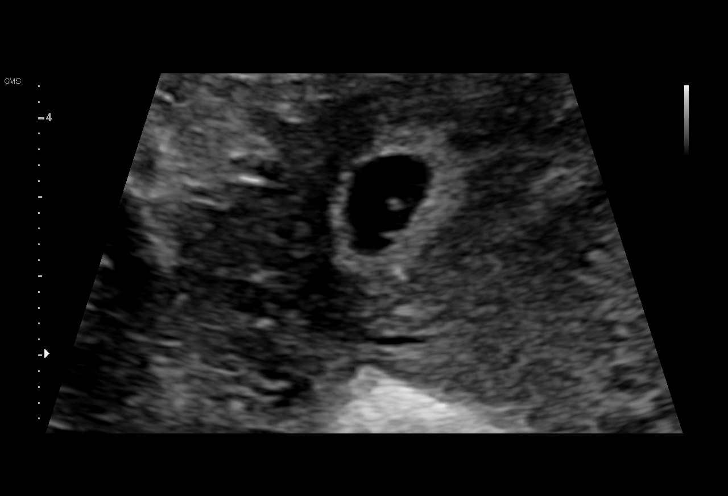
[im 17/39]
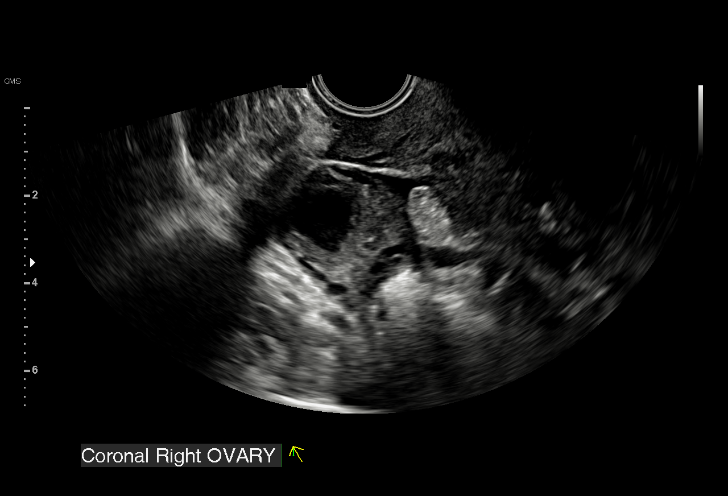
[im 20/39]
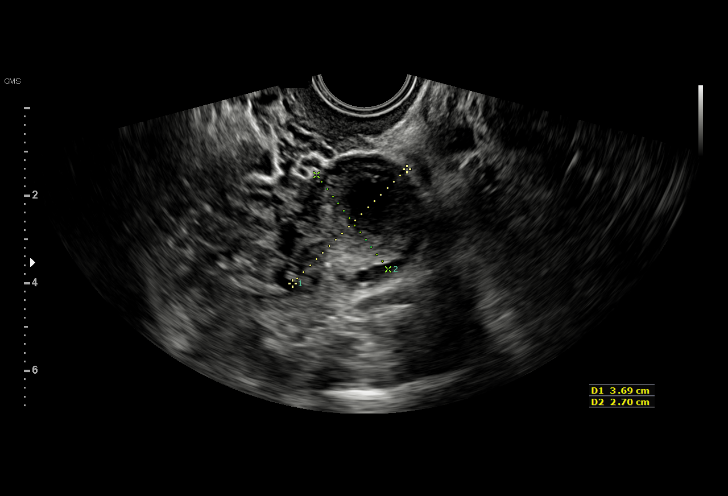
[im 22/39]
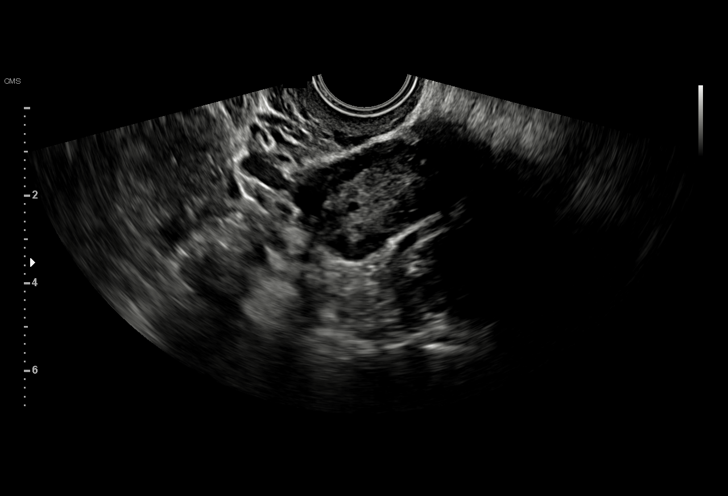
[im 24/39]
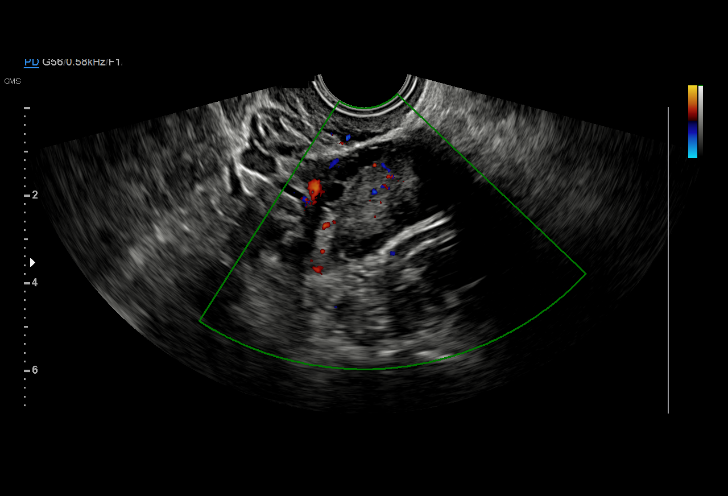
[im 27/39]
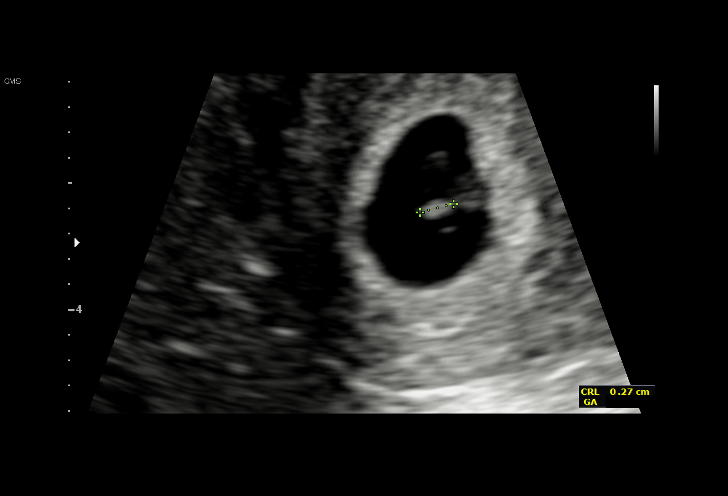
[im 30/39]
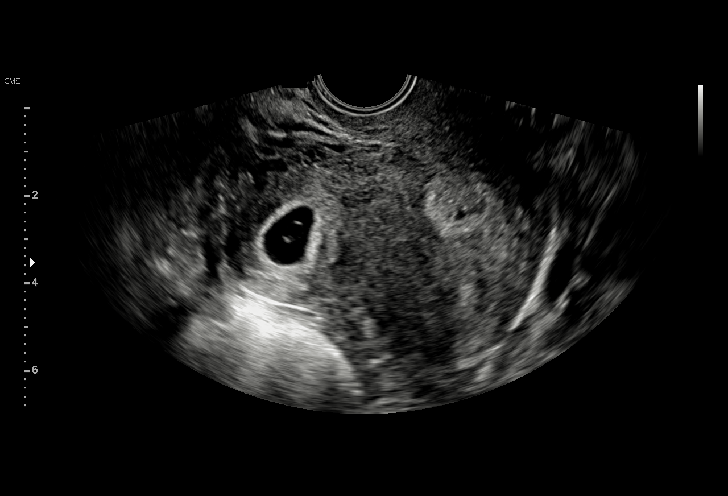
[im 33/39]
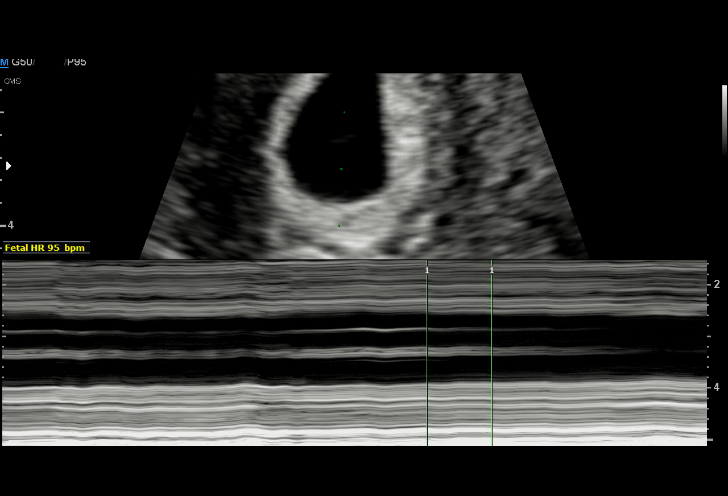
[im 36/39]
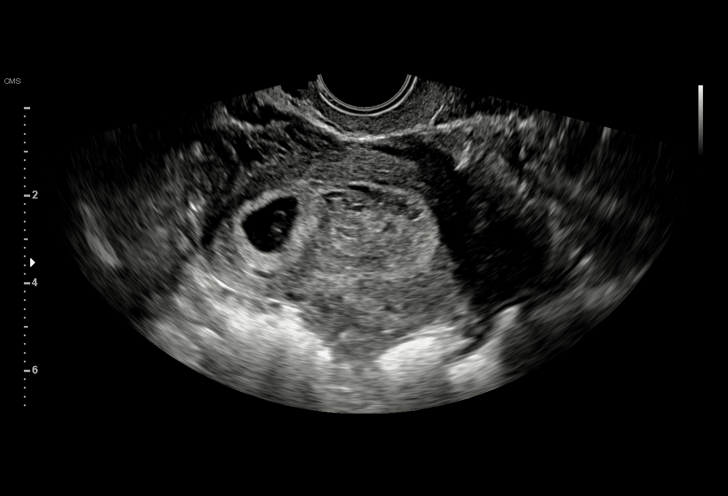
[im 39/39]
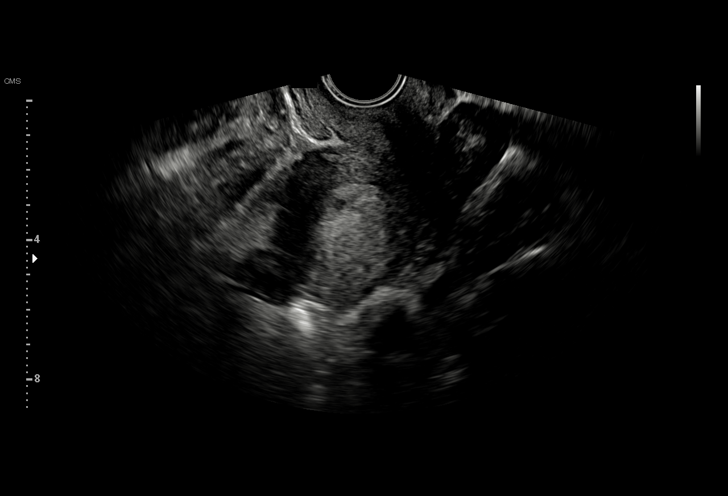

[15 of 28 positions shown; findings below may reference images not displayed]

FINDINGS: Intrauterine gestational sac: Single intrauterine pregnancy

Yolk sac:  Visible

Embryo:  Visible

Cardiac Activity: Visible

Heart Rate: 98 bpm

CRL: 2.9 mm   5 w   5 d                  US EDC: 03/29/2019

Subchorionic hemorrhage:  None visualized.

Maternal uterus/adnexae: Ovaries are within normal limits. The left
ovary measures 3.4 x 2.2 by 2 cm. The right ovary measures 3.2 x
x 2.7 cm and contains a corpus luteal body. No significant free
fluid
IMPRESSION: Single viable intrauterine pregnancy as above.

## 2020-07-08 DIAGNOSIS — E039 Hypothyroidism, unspecified: Secondary | ICD-10-CM | POA: Diagnosis not present

## 2020-07-08 DIAGNOSIS — M5431 Sciatica, right side: Secondary | ICD-10-CM | POA: Diagnosis not present

## 2020-07-14 DIAGNOSIS — Z0289 Encounter for other administrative examinations: Secondary | ICD-10-CM | POA: Diagnosis not present
